# Patient Record
Sex: Female | Born: 1990 | Race: Black or African American | Hispanic: No | Marital: Single | Smoking: Never smoker
Health system: Southern US, Community
[De-identification: ages and names within clinical notes are randomized; demographics above are authoritative.]

## PROBLEM LIST (undated history)

## (undated) ENCOUNTER — Inpatient Hospital Stay (HOSPITAL_COMMUNITY): Payer: Self-pay

## (undated) DIAGNOSIS — Z789 Other specified health status: Secondary | ICD-10-CM

## (undated) HISTORY — PX: BUNIONECTOMY: SHX129

## (undated) HISTORY — PX: WISDOM TOOTH EXTRACTION: SHX21

## (undated) HISTORY — PX: BREAST SURGERY: SHX581

---
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
## 2010-08-15 ENCOUNTER — Encounter: Payer: Self-pay | Admitting: Infectious Diseases

## 2010-08-26 NOTE — Assessment & Plan Note (Signed)
Summary: hsfu need chart large cellulitis infection   Vital Signs:  Patient profile:   20 year old female Height:      68 inches (172.72 cm) Weight:      160.5 pounds (72.95 kg) BMI:     24.49 Temp:     97.0 degrees F (36.11 degrees C) oral Pulse rate:   75 / minute BP sitting:   114 / 70  (left arm)  Vitals Entered By: Baxter Hire) (September 14, 2009 1:55 PM) \  CC: hsfu/cellulitis infection Pain Assessment Patient in pain? no      Nutritional Status BMI of 19 -24 = normal Nutritional Status Detail appetite is okay per patient  Does patient need assistance? Functional Status Self care Ambulation Normal   CC:  hsfu/cellulitis infection.  History of Present Illness: 20 yo F hospitalized earlier this month with dental space infections. Had wisdom teeth pulled 08-20-09. she then developed worsening pain and swelling her mouth and jaw. By 08-25-09 she had wosrenin pain and swelling. MRI (08-25-09)  1.  Persistent to soft tissue stranding and gas within the left  masticator space without evidence for discrete abscess. 2.  Gas within the extractions sockets of the maxillary molars bilaterally and left mandibular molar are similar with some fragmentation of the lateral extraction socket of the maxilla  bilaterally.  3.  Superior extension of gas within the masticator space  bilaterally is improving. 4.  Soft tissue stranding in the right maxillary space over the right face is less conspicuous than on the left.  4. Reactive adenopathy as described. She recieved .anbx in the ED but was given a po rx to take with her. unable to remember name.  Was seen in clinic the following day and then home. She developed worsening throat swelling and pain and was adm to the hospital . She  had I & D on Feb 3.  Was d/c home on Augmentin. Has had refilled recently. Still has trouble with full opening of her mouth and chewing. Her sores in her mouth have healed. no fevers or chills.    Preventive  Screening-Counseling & Management  Alcohol-Tobacco     Alcohol drinks/day: 0     Smoking Status: never  Caffeine-Diet-Exercise     Caffeine use/day: tea     Does Patient Exercise: yes     Type of exercise: walking at school     Exercise (avg: min/session): 30-60     Times/week: 5  Safety-Violence-Falls     Seat Belt Use: yes  Current Medications (verified): 1)  None  Allergies (verified): No Known Drug Allergies  Past History:  Past Medical History: Current Problems:  ABSCESS, TOOTH (ICD-522.5)  Family History: denies  Social History: Single Never Smoked Alcohol use-no student at [employer]  Review of Systems       no lymphadenopathy, wt down (18#), has had loose BM with antibiotics, nl urination, no yeast infections, no history of caries.   Physical Exam  General:  well-developed, well-nourished, and well-hydrated.   Eyes:  pupils equal, pupils round, and pupils reactive to light.   Mouth:  pharynx pink and moist.  limitation of opening her mouth, tenderness of her L masseter muslce, no fluctuatance.  Neck:  no masses.  lymphadenopathy  Lungs:  normal respiratory effort and normal breath sounds.   Heart:  normal rate, regular rhythm, and no murmur.   Abdomen:  soft, non-tender, and normal bowel sounds.     Impression & Recommendations:  Problem # 1:  ABSCESS, TOOTH (ICD-522.5)  spoke with her and her mom at length about this infection. She most likely has gotten an infection of her sockets after she had her wisdom teeth extracted. It is unlikely that she continues to have infection her but they are worried about her pain and limitation of opening. I suggested that we reapeat her MRI to further eval this. She is on a very high dose of augmentin. will not change this. return to clinic 1 week.   Orders: Consultation Level IV (40981) MRI with Contrast (MRI w/Contrast)  Medications Added to Medication List This Visit: 1)  Augmentin 875-125 Mg Tabs (Amoxicillin-pot  clavulanate) .... Take 1 tablet by mouth three times a day   Appended Document: hsfu need chart large cellulitis infection spoke with Dr Randa Evens office. Pt did recieve antibiotics rx after she had wisdom teeth extracted but did not have rx filled for several days.

## 2010-08-26 NOTE — Letter (Signed)
Summary: Out of Work  [facility]  [address]   [place], [place] [address]   Phone: [phone]  Fax: [phone]    September 21, 2009   Employee:  Amanda Rubio    To Whom It May Concern:   For Medical reasons, please excuse the above named employee from work for the following dates:  Start:   09-21-2009  End:   09-21-2009  If you need additional information, please feel free to contact our office.         Sincerely,    Johny Sax MD

## 2010-08-26 NOTE — Miscellaneous (Signed)
Summary: Orders Update  Clinical Lists Changes  Orders: Added new Test order of MRI with Contrast (MRI w/Contrast) - Signed Added new Test order of MRI with Contrast (MRI w/Contrast) - Signed

## 2010-08-26 NOTE — Assessment & Plan Note (Signed)
Summary: 2WK F/U/VS   CC:  2 week follow up.  History of Present Illness: 20 yo F with dental space infections. Had wisdom teeth pulled 08-20-09. she then developed worsening pain and swelling her mouth and jaw. By 08-25-09 she had wosrenign pain and swelling. MRI (08-25-09)  1.  Persistent to soft tissue stranding and gas within the left  masticator space without evidence for discrete abscess. 2.  Gas within the extractions sockets of the maxillary molars bilaterally and left mandibular molar are similar with some fragmentation of the lateral extraction socket of the maxilla  bilaterally.  3.  Superior extension of gas within the masticator space  bilaterally is improving. 4.  Soft tissue stranding in the right maxillary space over the right face is less conspicuous than on the left.  4. Reactive adenopathy as described. She recieved .anbx in the ED but was given a po rx to take with her. unable to remember name.  Was seen in clinic the following day and then home. She developed worsening throat swelling and pain and was adm to the hospital . She  had I & D on Feb 3. Was d/c home on Augmentin. At previous ID visit was sched for MRI but has been delayed due to finances.  Today she feels like she is opening her jaw better, stil some limitation. She is eating regular food. No diarhea.   Preventive Screening-Counseling & Management  Alcohol-Tobacco     Alcohol drinks/day: 0     Smoking Status: never  Caffeine-Diet-Exercise     Caffeine use/day: tea     Does Patient Exercise: yes     Type of exercise: walking at school     Exercise (avg: min/session): 30-60     Times/week: 5  Safety-Violence-Falls     Seat Belt Use: yes   Updated Prior Medication List: MOXATAG 775 MG XR24H-TAB (AMOXICILLIN) three times a day  Current Allergies (reviewed today): No known allergies  Past History:  Past medical, surgical, family and social histories (including risk factors) reviewed, and no changes noted  (except as noted below).  Past Medical History: Reviewed history from 09/14/2009 and no changes required. Current Problems:  ABSCESS, TOOTH (ICD-522.5)  Family History: Reviewed history from 09/14/2009 and no changes required. denies  Social History: Reviewed history from 09/14/2009 and no changes required. Single Never Smoked Alcohol use-no student at [employer]  Review of Systems       some decrease in her swelling of her cheeks/parotid area.   Vital Signs:  Patient profile:   20 year old female Height:      68 inches (172.72 cm) Weight:      162.6 pounds (73.91 kg) BMI:     24.81 Temp:     97.4 degrees F (36.33 degrees C) oral Pulse rate:   73 / minute BP sitting:   122 / 71  (left arm)  Vitals Entered By: Baxter Hire) (October 05, 2009 10:55 AM) CC: 2 week follow up Pain Assessment Patient in pain? no      Nutritional Status BMI of 19 -24 = normal Nutritional Status Detail appetite is okay per patient  Does patient need assistance? Functional Status Self care Ambulation Normal   Physical Exam  General:  well-developed, well-nourished, and well-hydrated.   Mouth:  mild tenderness along mandible on L. no lesions in mouth, no d/c, no fluctuance.    Impression & Recommendations:  Problem # 1:  ABSCESS, TOOTH (ICD-522.5)  awaiting MRI, scheduled for 09-15-09. we will  cont her antibiotics until she has the MRI and then re-eval the need for continued therapy. return to clinic 2 weeks.   Orders: Est. Patient Level II (16109)

## 2010-08-26 NOTE — Assessment & Plan Note (Signed)
Summary: F/U APPT/VS   CC:  follow-up visit.  History of Present Illness: 20 yo F with dental space infections. Had wisdom teeth pulled 08-20-09. she then developed worsening pain and swelling her mouth and jaw. By 08-25-09 she had wosrening pain and swelling. MRI (08-25-09)  1.  Persistent to soft tissue stranding and gas within the left  masticator space without evidence for discrete abscess. 2.  Gas within the extractions sockets of the maxillary molars bilaterally and left mandibular molar are similar with some fragmentation of the lateral extraction socket of the maxilla  bilaterally.  3.  Superior extension of gas within the masticator space  bilaterally is improving. 4.  Soft tissue stranding in the right maxillary space over the right face is less conspicuous than on the left.  4. Reactive adenopathy as described. She recieved .anbx in the ED but was given a po rx to take with her. unable to remember name.  Was seen in clinic the following day and then home. She developed worsening throat swelling and pain and was adm to the hospital . She  had I & D on Feb 3. Was d/c home on Augmentin. F/U MRI 10-12-09 1.  Persistent abnormal enhancement within the muscles of the masticator space, compatible with myositis. 2.  A discrete abscess is not present. 3.  No evidence for osteomyelitis. 4.  Persistent fluid and enhancement within the extraction sockets of the third molars bilaterally in the maxilla and mandible.   occas has feeling that her jaw is "locking up" but otherwise no pain. swelling resolved. sockets are healed. no fever or chills. occas senses a funny taste when lifting her tongue up to the L upper part of her mouth.   Preventive Screening-Counseling & Management  Alcohol-Tobacco     Alcohol drinks/day: 0     Smoking Status: never  Caffeine-Diet-Exercise     Caffeine use/day: tea     Type of exercise: active at home  Safety-Violence-Falls     Seat Belt Use: yes   Updated Prior  Medication List: MOXATAG 775 MG XR24H-TAB (AMOXICILLIN) three times a day  Current Allergies (reviewed today): No known allergies  Past History:  Past medical, surgical, family and social histories (including risk factors) reviewed, and no changes noted (except as noted below).  Past Medical History: Reviewed history from 09/14/2009 and no changes required. Current Problems:  ABSCESS, TOOTH (ICD-522.5)  Family History: Reviewed history from 09/14/2009 and no changes required. denies  Social History: Reviewed history from 09/14/2009 and no changes required. Single Never Smoked Alcohol use-no student at [employer]  Vital Signs:  Patient profile:   20 year old female Height:      68 inches (172.72 cm) Weight:      162.8 pounds (74 kg) BMI:     24.84 Temp:     98.2 degrees F (36.78 degrees C) oral Pulse rate:   94 / minute BP sitting:   112 / 73  (right arm)  Vitals Entered By: Baxter Hire) (Dec 07, 2009 10:33 AM) CC: follow-up visit Pain Assessment Patient in pain? yes     Location: left side of jaw Intensity: 6 Type: locking Onset of pain  the feeling of jaw locking comes and goes Nutritional Status BMI of 19 -24 = normal Nutritional Status Detail appetite is good per patient  Does patient need assistance? Functional Status Self care Ambulation Normal   Physical Exam  General:  well-developed, well-nourished, and well-hydrated.   Mouth:  without lesion good dentition,  no gingival abnormalities, and pharynx pink and moist.   Neck:  no masses and no neck tenderness.  no submental tenderness or swelling.    Impression & Recommendations:  Problem # 1:  ABSCESS, TOOTH (ICD-522.5) Assessment Improved  she is doing well. she has completed the augmentin. will watch her clinically as her previous signs appear to have resolved (swelling, tenderness; limitation of jaw opening is near normal now). we discussed repeating MRI but will watch her clinically.  she  will f/u as needed   Orders: Est. Patient Level II (04540)

## 2010-08-26 NOTE — Assessment & Plan Note (Signed)
Summary: 1WK F/U/VS   CC:  1 week follow up.  History of Present Illness: 20 yo F hospitalized earlier this month with dental space infections. Had wisdom teeth pulled 08-20-09. she then developed worsening pain and swelling her mouth and jaw. By 08-25-09 she had wosrenign pain and swelling. MRI (08-25-09)  1.  Persistent to soft tissue stranding and gas within the left  masticator space without evidence for discrete abscess. 2.  Gas within the extractions sockets of the maxillary molars bilaterally and left mandibular molar are similar with some fragmentation of the lateral extraction socket of the maxilla  bilaterally.  3.  Superior extension of gas within the masticator space  bilaterally is improving. 4.  Soft tissue stranding in the right maxillary space over the right face is less conspicuous than on the left.  4. Reactive adenopathy as described. She recieved .anbx in the ED but was given a po rx to take with her. unable to remember name.  Was seen in clinic the following day and then home. She developed worsening throat swelling and pain and was adm to the hospital . She  had I & D on Feb 3. Was d/c home on Augmentin. She continues to have difficulty with opening her mouth. she has to have her food chopped up. no fevers or chills. no d/c from previous sockets. on amoxitab samples now.    Preventive Screening-Counseling & Management  Alcohol-Tobacco     Alcohol drinks/day: 0     Smoking Status: never  Caffeine-Diet-Exercise     Caffeine use/day: tea     Does Patient Exercise: yes     Type of exercise: walking at school     Exercise (avg: min/session): 30-60     Times/week: 5  Safety-Violence-Falls     Seat Belt Use: yes   Current Allergies (reviewed today): No known allergies  Review of Systems       wt up 1.9#  Vital Signs:  Patient profile:   20 year old female Height:      68 inches (172.72 cm) Weight:      161.9 pounds (73.59 kg) BMI:     24.71 Temp:     97.8 degrees F  (36.56 degrees C) oral Pulse rate:   72 / minute BP sitting:   110 / 72  (right arm)  Vitals Entered By: Baxter Hire) (September 21, 2009 9:33 AM) CC: 1 week follow up Pain Assessment Patient in pain? no      Nutritional Status BMI of 19 -24 = normal Nutritional Status Detail appetite is okay per patient  Does patient need assistance? Functional Status Self care Ambulation Normal   Physical Exam  General:  well-developed, well-nourished, and well-hydrated.   Mouth:  pharynx pink and moist and no exudates.  some tenderness over L maxilla near TMJ. no fluctuance, no increase in heat.  Neck:  no masses.   Cervical Nodes:  no anterior cervical adenopathy.     Impression & Recommendations:  Problem # 1:  ABSCESS, TOOTH (ICD-522.5)  she has made some improvement. at this point will continue her on amoxil and check imaging of her face/maxilofacial. she is awaiting 'orange card' will see her back in 2 weeks.   Orders: Consultation Level III [phone]) CT with Contrast (CT w/ contrast)  Medications Added to Medication List This Visit: 1)  Moxatag 775 Mg Xr24h-tab (Amoxicillin) .... Three times a day

## 2010-08-26 NOTE — Miscellaneous (Signed)
Summary: HIPAA Restrictions  HIPAA Restrictions   Imported By: Florinda Marker 09/14/2009 16:35:22  _____________________________________________________________________  External Attachment:    Type:   Image     Comment:   External Document

## 2010-08-26 NOTE — Miscellaneous (Signed)
Summary: Orders Update - change MRI order to w/ and w/o  Clinical Lists Changes  Orders: Added new Test order of MRI with & without Contrast (MRI w&w/o Contrast) - Signed Added new Test order of MRI with & without Contrast (MRI w&w/o Contrast) - Signed

## 2010-10-11 LAB — DIFFERENTIAL
Basophils Absolute: 0 10*3/uL (ref 0.0–0.1)
Basophils Relative: 0 % (ref 0–1)
Basophils Relative: 1 % (ref 0–1)
Eosinophils Absolute: 0.1 10*3/uL (ref 0.0–0.7)
Eosinophils Absolute: 0.1 10*3/uL (ref 0.0–0.7)
Lymphocytes Relative: 10 % — ABNORMAL LOW (ref 12–46)
Lymphocytes Relative: 22 % (ref 12–46)
Lymphs Abs: 1.4 10*3/uL (ref 0.7–4.0)
Monocytes Absolute: 0.9 10*3/uL (ref 0.1–1.0)
Monocytes Relative: 10 % (ref 3–12)
Neutro Abs: 8 10*3/uL — ABNORMAL HIGH (ref 1.7–7.7)
Neutrophils Relative %: 80 % — ABNORMAL HIGH (ref 43–77)
Neutrophils Relative %: 67 % (ref 43–77)

## 2010-10-11 LAB — COMPREHENSIVE METABOLIC PANEL
ALT: 11 U/L (ref 0–35)
Albumin: 3.7 g/dL (ref 3.5–5.2)
Alkaline Phosphatase: 57 U/L (ref 39–117)
CO2: 25 mEq/L (ref 19–32)
Calcium: 9.5 mg/dL (ref 8.4–10.5)
Creatinine, Ser: 0.78 mg/dL (ref 0.4–1.2)
GFR calc non Af Amer: >60 mL/min (ref >60)
Sodium: 136 mEq/L (ref 135–145)
Total Protein: 7.4 g/dL (ref 6.0–8.3)

## 2010-10-11 LAB — POCT I-STAT, CHEM 8
BUN: 7 mg/dL (ref 6–23)
Calcium, Ion: 1.22 mmol/L (ref 1.12–1.32)
Chloride: 108 mEq/L (ref 96–112)
Glucose, Bld: 92 mg/dL (ref 70–99)
HCT: 41 % (ref 36.0–46.0)
Hemoglobin: 13.9 g/dL (ref 12.0–15.0)
TCO2: 24 mmol/L (ref 0–100)

## 2010-10-11 LAB — URINE MICROSCOPIC-ADD ON

## 2010-10-11 LAB — URINALYSIS, ROUTINE W REFLEX MICROSCOPIC
Glucose, UA: NEGATIVE mg/dL
Ketones, ur: NEGATIVE mg/dL
Nitrite: NEGATIVE
Specific Gravity, Urine: 1.013 (ref 1.005–1.030)

## 2010-10-11 LAB — WET PREP, GENITAL

## 2010-10-11 LAB — CBC
HCT: 36.4 % (ref 36.0–46.0)
Hemoglobin: 11.6 g/dL — ABNORMAL LOW (ref 12.0–15.0)
MCHC: 34.4 g/dL (ref 30.0–36.0)
MCHC: 34.2 g/dL (ref 30.0–36.0)
Platelets: 207 10*3/uL (ref 150–400)
RBC: 3.99 MIL/uL (ref 3.87–5.11)
RDW: 12.7 % (ref 11.5–15.5)
WBC: 10 10*3/uL (ref 4.0–10.5)
WBC: 12.1 10*3/uL — ABNORMAL HIGH (ref 4.0–10.5)
WBC: 6.4 10*3/uL (ref 4.0–10.5)

## 2010-10-11 LAB — URINE CULTURE: Colony Count: >=100000

## 2010-10-11 LAB — GC/CHLAMYDIA PROBE AMP, GENITAL: GC Probe Amp, Genital: NEGATIVE

## 2010-10-14 LAB — CBC
HCT: 33.7 % — ABNORMAL LOW (ref 36.0–46.0)
HCT: 34.1 % — ABNORMAL LOW (ref 36.0–46.0)
HCT: 34.4 % — ABNORMAL LOW (ref 36.0–46.0)
HCT: 35.3 % — ABNORMAL LOW (ref 36.0–46.0)
Hemoglobin: 11.3 g/dL — ABNORMAL LOW (ref 12.0–15.0)
Hemoglobin: 11.4 g/dL — ABNORMAL LOW (ref 12.0–15.0)
Hemoglobin: 11.6 g/dL — ABNORMAL LOW (ref 12.0–15.0)
Hemoglobin: 12 g/dL (ref 12.0–15.0)
MCHC: 33.9 g/dL (ref 30.0–36.0)
MCHC: 34 g/dL (ref 30.0–36.0)
MCHC: 34.3 g/dL (ref 30.0–36.0)
MCHC: 34.4 g/dL (ref 30.0–36.0)
MCHC: 34.4 g/dL (ref 30.0–36.0)
MCV: 90 fL (ref 78.0–100.0)
MCV: 91 fL (ref 78.0–100.0)
MCV: 91.8 fL (ref 78.0–100.0)
Platelets: 219 10*3/uL (ref 150–400)
Platelets: 226 10*3/uL (ref 150–400)
Platelets: 229 10*3/uL (ref 150–400)
Platelets: 245 10*3/uL (ref 150–400)
Platelets: 293 10*3/uL (ref 150–400)
Platelets: 326 10*3/uL (ref 150–400)
RBC: 3.63 MIL/uL — ABNORMAL LOW (ref 3.87–5.11)
RBC: 3.71 MIL/uL — ABNORMAL LOW (ref 3.87–5.11)
RBC: 3.91 MIL/uL (ref 3.87–5.11)
RDW: 12.8 % (ref 11.5–15.5)
RDW: 12.9 % (ref 11.5–15.5)
RDW: 12.9 % (ref 11.5–15.5)
RDW: 13.2 % (ref 11.5–15.5)
WBC: 12.3 10*3/uL — ABNORMAL HIGH (ref 4.0–10.5)
WBC: 8.7 10*3/uL (ref 4.0–10.5)
WBC: 8.8 10*3/uL (ref 4.0–10.5)

## 2010-10-14 LAB — CULTURE, ROUTINE-ABSCESS

## 2010-10-14 LAB — ANAEROBIC CULTURE

## 2010-12-10 NOTE — Op Note (Signed)
NAME:  Amanda Rubio, Amanda Rubio [facility]                      ACCOUNT NO.:  [id number]   MEDICAL RECORD NO.:  [id number]                   PATIENT TYPE:  AMB   LOCATION:  DSC                                  FACILITY:  [facility]   PHYSICIAN:  Leonie Man, M.D.                DATE OF BIRTH:  [birth year]   DATE OF PROCEDURE:  12/25/2003  DATE OF DISCHARGE:                                 OPERATIVE REPORT   PREOPERATIVE DIAGNOSIS:  Left breast mass, probable fibroadenoma.   POSTOPERATIVE DIAGNOSIS:  Left breast mass, probable fibroadenoma.  Pathology pending.   OPERATION PERFORMED:  Excisional biopsy of left breast mass.   SURGEON:  Leonie Man, M.D.   ASSISTANT:  Nurse.   ANESTHESIA:  General.   INDICATIONS FOR PROCEDURE:  The patient is a 20 year old female with an  enlarging left-sided breast mass located just at the areolar border.  Both  she and her parents desire excisional biopsy of this lesion.  She comes to  the operating room now after the risks and potential benefits of this have  been discussed with her and her family and they give consent.   DESCRIPTION OF PROCEDURE:  Following the induction of satisfactory general  anesthesia, the patient positioned supinely and the left breast was prepped  and draped to be included in a sterile operative field.  A circumareolar  incision was made on the inferior edge of the areolar border deepening this  through the skin and subcutaneous tissue raising a flap inferiorly and  dissecting down to the mass.  The mass was dissected free and removed in its  entirety and forwarded for pathologic evaluation.  Hemostasis was obtained  with electrocautery.  Sponge and instrument counts were verified.  Breast  tissues were reapproximated with interrupted 2-0 Vicryl sutures.  Subcutaneous tissue was closed with interrupted 2-0 Vicryl sutures.  Skin  closed with running 5-0 Monocryl suture and then reinforced with Steri-  Strips.  Sterile dressing applied.   Anesthetic was reversed and the patient  removed from the operating room to the recovery room in stable condition.  She tolerated the procedure well.                                               Leonie Man, M.D.    PB/MEDQ  D:  12/25/2003  T:  12/25/2003  Job:  [id number]

## 2011-05-04 ENCOUNTER — Emergency Department (HOSPITAL_BASED_OUTPATIENT_CLINIC_OR_DEPARTMENT_OTHER)
Admission: EM | Admit: 2011-05-04 | Discharge: 2011-05-04 | Disposition: A | Discharge status: Home / Self Care | Payer: Self-pay | Attending: Emergency Medicine | Admitting: Emergency Medicine

## 2011-05-04 DIAGNOSIS — A499 Bacterial infection, unspecified: Secondary | ICD-10-CM | POA: Insufficient documentation

## 2011-05-04 DIAGNOSIS — N390 Urinary tract infection, site not specified: Secondary | ICD-10-CM | POA: Insufficient documentation

## 2011-05-04 DIAGNOSIS — N760 Acute vaginitis: Secondary | ICD-10-CM | POA: Insufficient documentation

## 2011-05-04 DIAGNOSIS — R51 Headache: Secondary | ICD-10-CM | POA: Insufficient documentation

## 2011-05-04 DIAGNOSIS — B9689 Other specified bacterial agents as the cause of diseases classified elsewhere: Secondary | ICD-10-CM | POA: Insufficient documentation

## 2011-05-04 LAB — URINALYSIS, ROUTINE W REFLEX MICROSCOPIC
Bilirubin Urine: NEGATIVE
Nitrite: NEGATIVE
Specific Gravity, Urine: 1.027 (ref 1.005–1.030)
Urobilinogen, UA: 1 mg/dL (ref 0.0–1.0)
pH: 6 (ref 5.0–8.0)

## 2011-05-04 LAB — WET PREP, GENITAL: Trich, Wet Prep: NONE SEEN

## 2011-05-04 LAB — URINE MICROSCOPIC-ADD ON

## 2011-05-04 MED ORDER — KETOROLAC TROMETHAMINE 60 MG/2ML IM SOLN
60.0000 mg | Freq: Once | INTRAMUSCULAR | Status: AC
  Administered 2011-05-04: 60 mg via INTRAMUSCULAR
  Filled 2011-05-04: qty 2

## 2011-05-04 MED ORDER — METOCLOPRAMIDE HCL 5 MG/ML IJ SOLN
10.0000 mg | Freq: Once | INTRAMUSCULAR | Status: AC
  Administered 2011-05-04: 10 mg via INTRAMUSCULAR
  Filled 2011-05-04: qty 2

## 2011-05-04 MED ORDER — SULFAMETHOXAZOLE-TRIMETHOPRIM 800-160 MG PO TABS: 1.0000 | ORAL_TABLET | Freq: Two times a day (BID) | ORAL | Status: AC

## 2011-05-04 MED ORDER — METRONIDAZOLE 500 MG PO TABS: 500.0000 mg | ORAL_TABLET | Freq: Two times a day (BID) | ORAL | Status: AC

## 2011-05-04 NOTE — ED Notes (Signed)
C/o HA behind left eye x 8 months-bilat lower abd cramps x 1 month

## 2011-05-04 NOTE — ED Provider Notes (Signed)
History     CSN: [id number] Arrival date & time: 05/04/2011 11:23 AM  Chief Complaint  Patient presents with  . Abdominal Pain  . Headache    (Consider location/radiation/quality/duration/timing/severity/associated sxs/prior treatment) Patient is a 20 y.o. female presenting with abdominal pain and headaches. The history is provided by the patient. No language interpreter was used.  Abdominal Pain The primary symptoms of the illness include abdominal pain and vaginal discharge. The primary symptoms of the illness do not include fever, nausea, vomiting or vaginal bleeding. The current episode started more than 2 days ago. The onset of the illness was gradual. The problem has not changed since onset. The patient states that she believes she is currently not pregnant. The patient has not had a change in bowel habit. Symptoms associated with the illness do not include urgency, frequency or back pain.  Headache  This is a recurrent problem. The current episode started more than 1 week ago. The problem has not changed since onset.The headache is associated with an unknown factor. The pain is located in the left unilateral region. The quality of the pain is described as throbbing. Pertinent negatives include no fever, no nausea and no vomiting. She has tried nothing for the symptoms. The treatment provided no relief.    History reviewed. No pertinent past medical history.  Past Surgical History  Procedure Date  . Bunionectomy   . Wisdom tooth extraction   . Breast surgery     No family history on file.  History  Substance Use Topics  . Smoking status: Never Smoker   . Smokeless tobacco: Not on file  . Alcohol Use: No    OB History    Grav Para Term Preterm Abortions TAB SAB Ect Mult Living                  Review of Systems  Constitutional: Negative for fever.  Gastrointestinal: Positive for abdominal pain. Negative for nausea and vomiting.  Genitourinary: Positive for vaginal  discharge. Negative for urgency, frequency and vaginal bleeding.  Musculoskeletal: Negative for back pain.  Neurological: Positive for headaches.  All other systems reviewed and are negative.    Allergies  Review of patient's allergies indicates no known allergies.  Home Medications   Current Outpatient Rx  Name Route Sig Dispense Refill  . AMOXICILLIN 775 MG PO TB24 Oral Take 775 mg by mouth 3 (three) times daily.        BP 144/81  Pulse 91  Temp(Src) 98.3 F (36.8 C) (Oral)  Resp 16  SpO2 97%  Physical Exam  Nursing note and vitals reviewed. Constitutional: She is oriented to person, place, and time. She appears well-developed and well-nourished.  HENT:  Head: Atraumatic.  Right Ear: External ear normal.  Left Ear: External ear normal.  Eyes: Conjunctivae are normal. Pupils are equal, round, and reactive to light.  Neck: Normal range of motion. Neck supple.  Cardiovascular: Normal rate and regular rhythm.   Pulmonary/Chest: Effort normal and breath sounds normal.  Abdominal: Soft. Bowel sounds are normal.  Genitourinary: Cervix exhibits no motion tenderness. Vaginal discharge found.  Musculoskeletal: Normal range of motion.  Neurological: She is alert and oriented to person, place, and time.  Skin: Skin is warm and dry.  Psychiatric: She has a normal mood and affect.    ED Course  Procedures (including critical care time)  Labs Reviewed  URINALYSIS, ROUTINE W REFLEX MICROSCOPIC - Abnormal; Notable for the following:    Color, Urine AMBER (*) BIOCHEMICALS  MAY BE AFFECTED BY COLOR   Appearance CLOUDY (*)    Ketones, ur 15 (*)    Protein, ur 30 (*)    Leukocytes, UA MODERATE (*)    All other components within normal limits  URINE MICROSCOPIC-ADD ON - Abnormal; Notable for the following:    Squamous Epithelial / LPF MANY (*)    Bacteria, UA MANY (*)    All other components within normal limits  WET PREP, GENITAL - Abnormal; Notable for the following:    Clue  Cells, Wet Prep FEW (*)    WBC, Wet Prep HPF POC MODERATE (*)    All other components within normal limits  PREGNANCY, URINE  GC/CHLAMYDIA PROBE AMP, GENITAL   No results found.   1. Bacterial vaginosis   2. UTI (lower urinary tract infection)   3. Headache       MDM  Will treat pt for uti and bv:pt is feeling better as far as the headache is concerned at this time   Medical screening examination/treatment/procedure(s) were performed by non-physician practitioner and as supervising physician I was immediately available for consultation/collaboration.    Teressa Lower, NP 05/04/11 1248  Suzi Roots, MD 05/14/11 [phone]

## 2011-05-06 LAB — GC/CHLAMYDIA PROBE AMP, GENITAL: GC Probe Amp, Genital: NEGATIVE

## 2011-05-07 NOTE — ED Notes (Signed)
+   chlamydia Chart sent to EDP office for review. 

## 2011-05-12 NOTE — ED Notes (Signed)
Prescription called in to [facility] at [phone] for doxycycline 100 mg po bid for 7 days; no refills.

## 2012-01-20 IMAGING — CT CT MAXILLOFACIAL W/ CM
3 series · 15 of 47 positions shown, 18 images · IV contrast (omnipaque)
Comparison: CT neck 08/23/2009.

CLINICAL DATA: Bilateral mandibular cellulitis.  Difficulty
swallowing.  Increasing white count.  Status post wisdom tooth
extraction.

CT MAXILLOFACIAL WITH CONTRAST
TECHNIQUE: Multidetector CT imaging of the maxillofacial
structures was performed with intravenous contrast. Multiplanar CT
image reconstructions were also generated.
Contrast: 100 ml Omnipaque 300

[Series 3: orbit/facial 2.0 h30s · axial · 0.35mm/px · z∈[+1220,+1362]mm · 9 of 83 slices shown, 12 images]
[im 6/83  brain]
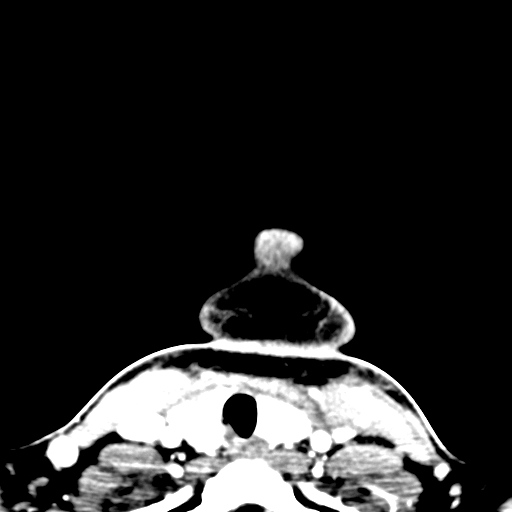
[im 6/83  bone]
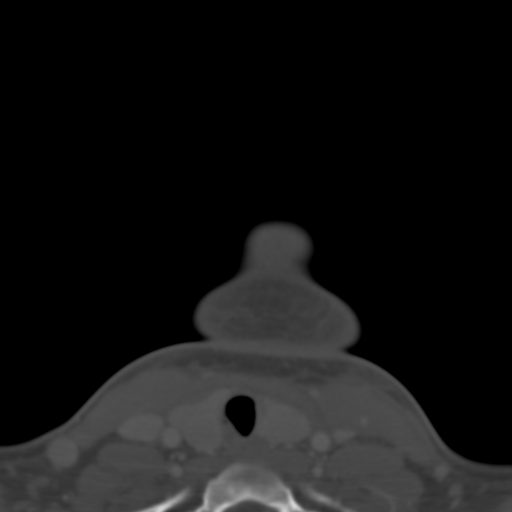
[im 15/83  bone]
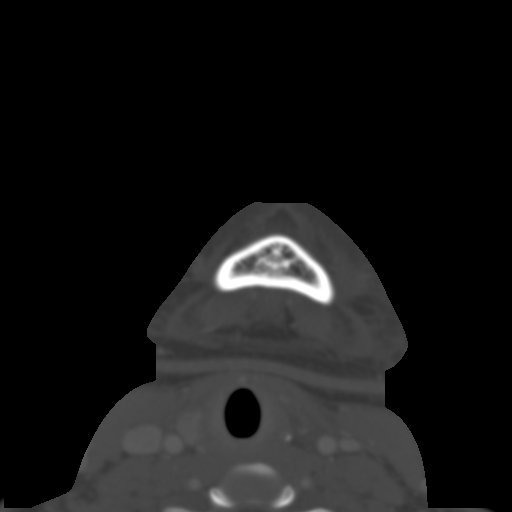
[im 23/83  bone]
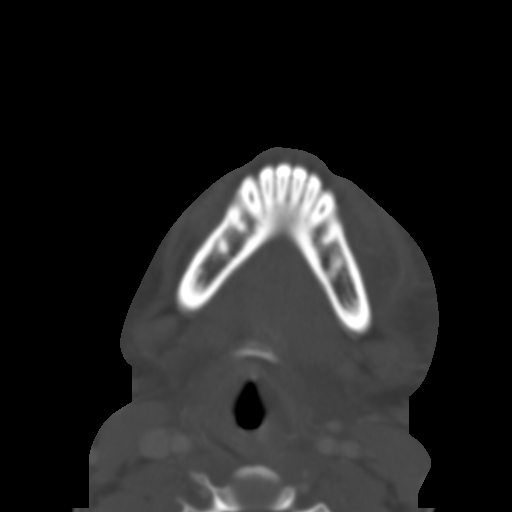
[im 32/83  bone]
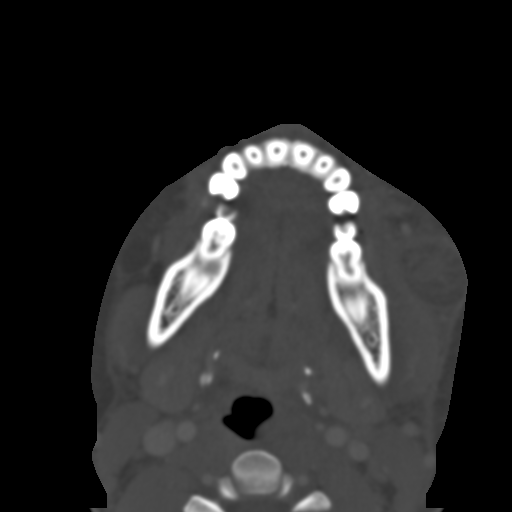
[im 43/83  brain]
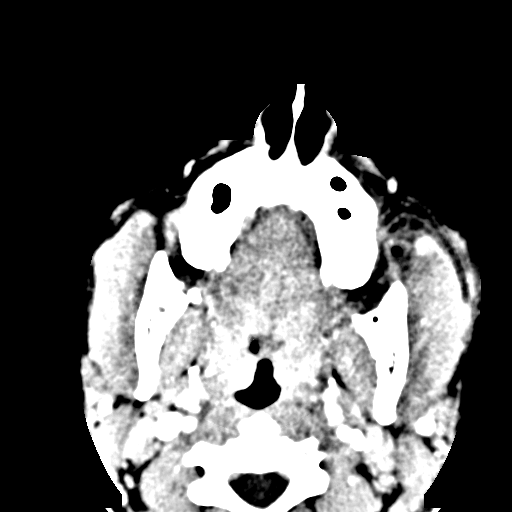
[im 43/83  bone]
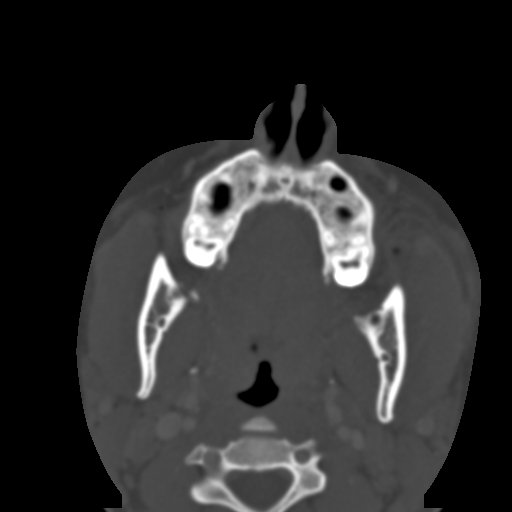
[im 51/83  bone]
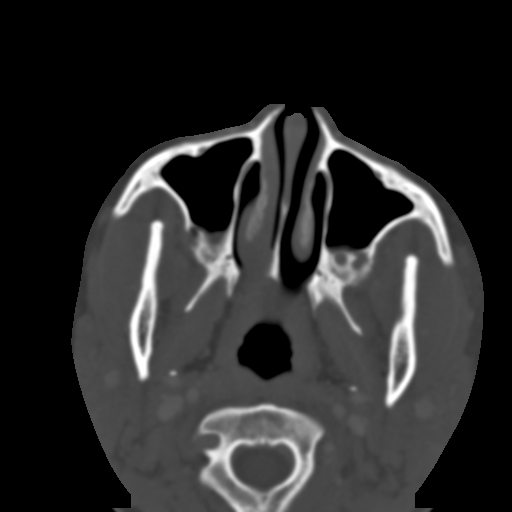
[im 60/83  bone]
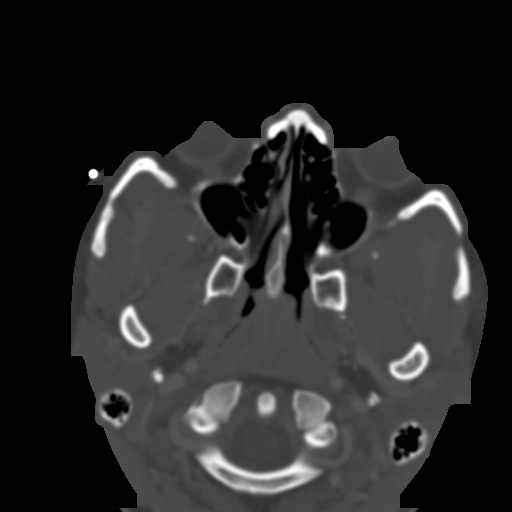
[im 68/83  bone]
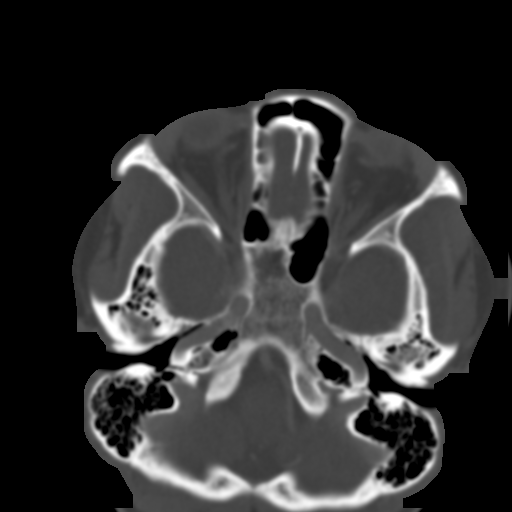
[im 77/83  brain]
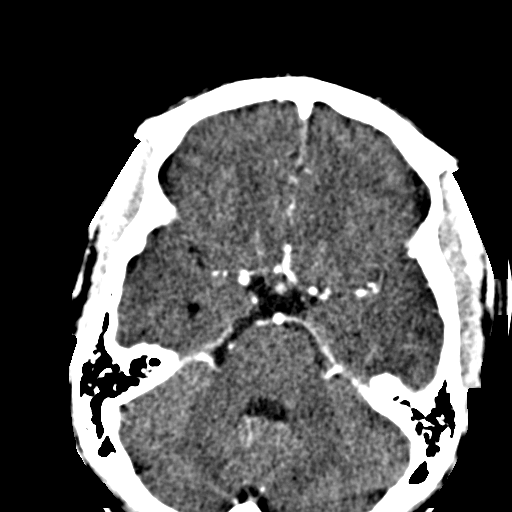
[im 77/83  bone]
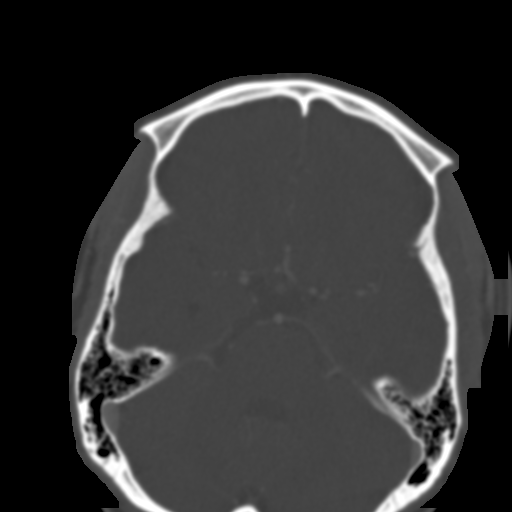

[Series 604: cor st · coronal · 0.35mm/px · 3 of 68 slices shown]
[im 23/68  bone]
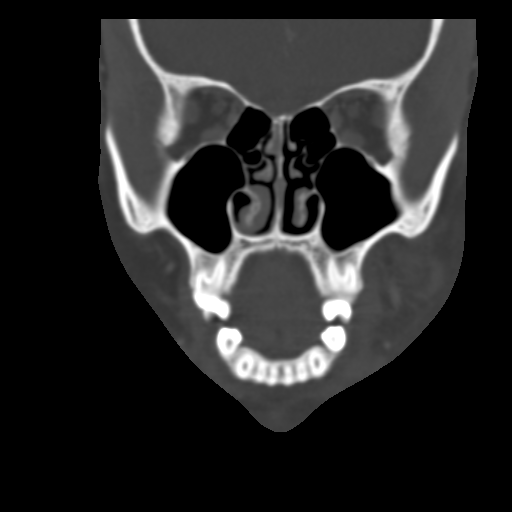
[im 30/68  bone]
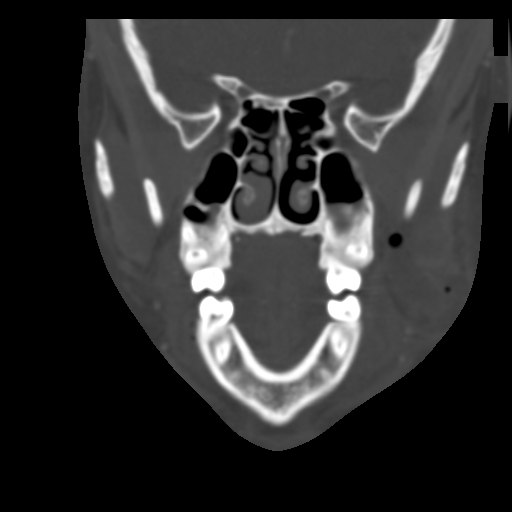
[im 38/68  bone]
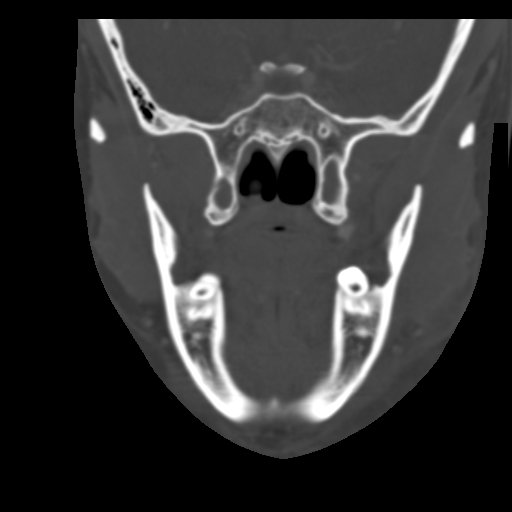

[Series 605: <mpr thick range> · sagittal · 0.35mm/px · 3 of 77 slices shown]
[im 26/77  bone]
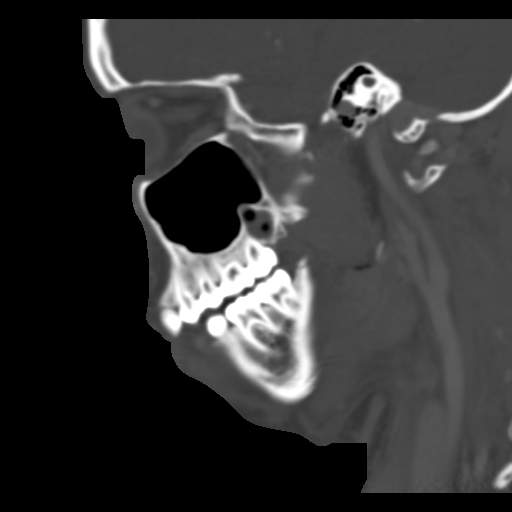
[im 39/77  bone]
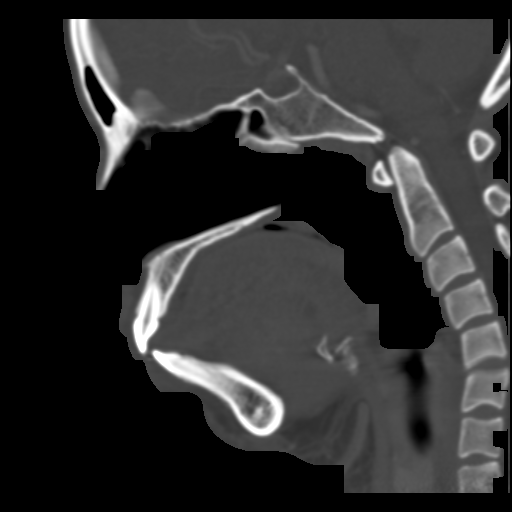
[im 51/77  bone]
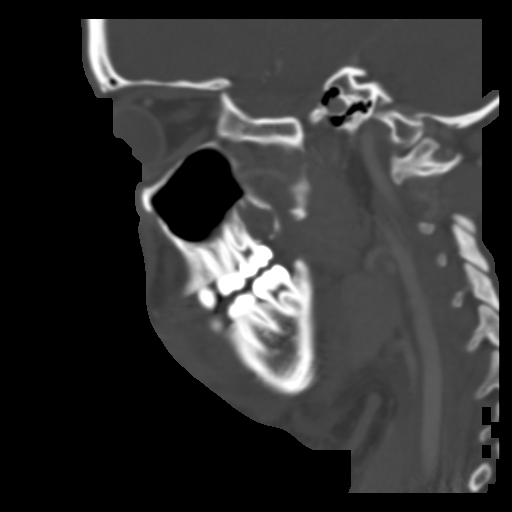

[15 of 47 positions shown; findings below may reference images not displayed]

FINDINGS: The the patient is status post extraction of the third
mandibular and maxillary molars bilaterally.  Gas continues to be
seen within the extraction sockets of the maxillary molars
bilaterally.  There is gas within the left 3rd molar mandibular
extractions socket.  Only soft tissue is present on the right.

Fragmentation of the lateral aspect of the left maxillary
extraction socket is similar to the prior study.  Fragmentation
laterally on the right is similar as well.

Edematous changes are present within the masticator space on the
left with areas of gas again noted.  Extensive stranding of the
subcutaneous soft tissues over left face are again noted.  No
discrete abscess is evident although there may be some fluid within
the masticator space.  The parapharyngeal fat is clean bilaterally.

There is some stranding within the masticator space on the right
and mild stranding of the subcutaneous soft tissues as well, to a
lesser extent.  The gas previously seen extending superiorly to the
left pterygomandibular fissure is no longer present.

Bilateral submandibular adenopathy is again noted, similar to the
prior study.  Bilateral level II lymph nodes are also present,
worse on the left.

Limited imaging of the brain is unremarkable.
IMPRESSION: 1.  Persistent to soft tissue stranding and gas within the left
masticator space without evidence for discrete abscess.
2.  Gas within the extractions sockets of the maxillary molars
bilaterally and left mandibular molar are similar with some
fragmentation of the lateral extraction socket of the maxilla
bilaterally.
3.  Superior extension of gas within the masticator space
bilaterally is improving.

4.  Soft tissue stranding in the right maxillary space over the
right face is less conspicuous than on the left.
4.  Reactive adenopathy as described.

## 2013-01-24 ENCOUNTER — Inpatient Hospital Stay (HOSPITAL_COMMUNITY)
Admission: AD | Admit: 2013-01-24 | Discharge: 2013-01-24 | Disposition: A | Discharge status: Home / Self Care | Payer: Self-pay | Source: Ambulatory Visit | Attending: Obstetrics & Gynecology | Admitting: Obstetrics & Gynecology

## 2013-01-24 ENCOUNTER — Encounter (HOSPITAL_COMMUNITY): Payer: Self-pay | Admitting: *Deleted

## 2013-01-24 DIAGNOSIS — A5901 Trichomonal vulvovaginitis: Secondary | ICD-10-CM | POA: Insufficient documentation

## 2013-01-24 DIAGNOSIS — B9689 Other specified bacterial agents as the cause of diseases classified elsewhere: Secondary | ICD-10-CM

## 2013-01-24 DIAGNOSIS — A499 Bacterial infection, unspecified: Secondary | ICD-10-CM

## 2013-01-24 DIAGNOSIS — N760 Acute vaginitis: Secondary | ICD-10-CM

## 2013-01-24 DIAGNOSIS — Z3202 Encounter for pregnancy test, result negative: Secondary | ICD-10-CM | POA: Insufficient documentation

## 2013-01-24 DIAGNOSIS — R109 Unspecified abdominal pain: Secondary | ICD-10-CM | POA: Insufficient documentation

## 2013-01-24 DIAGNOSIS — N920 Excessive and frequent menstruation with regular cycle: Secondary | ICD-10-CM

## 2013-01-24 DIAGNOSIS — A599 Trichomoniasis, unspecified: Secondary | ICD-10-CM

## 2013-01-24 HISTORY — DX: Other specified health status: Z78.9

## 2013-01-24 LAB — URINALYSIS, ROUTINE W REFLEX MICROSCOPIC
Bilirubin Urine: NEGATIVE
Nitrite: NEGATIVE
Protein, ur: 30 mg/dL — AB
Specific Gravity, Urine: 1.025 (ref 1.005–1.030)
Urobilinogen, UA: 1 mg/dL (ref 0.0–1.0)

## 2013-01-24 LAB — WET PREP, GENITAL: Yeast Wet Prep HPF POC: NONE SEEN

## 2013-01-24 LAB — POCT PREGNANCY, URINE: Preg Test, Ur: NEGATIVE

## 2013-01-24 LAB — CBC
MCH: 28 pg (ref 26.0–34.0)
MCHC: 32.7 g/dL (ref 30.0–36.0)
MCV: 85.5 fL (ref 78.0–100.0)
Platelets: 275 10*3/uL (ref 150–400)
RDW: 13.1 % (ref 11.5–15.5)

## 2013-01-24 MED ORDER — METRONIDAZOLE 500 MG PO TABS: 500.0000 mg | ORAL_TABLET | Freq: Two times a day (BID) | ORAL | Status: DC

## 2013-01-24 NOTE — MAU Provider Note (Signed)
Attestation of Attending Supervision of Advanced Practitioner (PA/CNM/NP): Evaluation and management procedures were performed by the Advanced Practitioner under my supervision and collaboration.  I have reviewed the Advanced Practitioner's note and chart, and I agree with the management and plan.  Aariona Momon, MD, FACOG Attending Obstetrician & Gynecologist Faculty Practice, [facility]  

## 2013-01-24 NOTE — MAU Note (Signed)
Patient states she started having abdominal cramping on 6-30. Started spotting on 7-1 and this morning had heavy bleeding and passed a fist size clot x 2. Continues to bleeding like a period and having cramping.

## 2013-01-24 NOTE — MAU Provider Note (Signed)
History     CSN: [id number]  Arrival date and time: 01/24/13 1341   None     Chief Complaint  Patient presents with  . Vaginal Bleeding  . Abdominal Cramping   HPI Amanda Rubio is a 22 y.o. G47P1001 female who presents w/ report of LNMP 6/11, abdominal cramping began 6/30 w/ pink d/c w/ normal menses flow on 7/1, heavier bleeding this am- woke up and clothes/bed was stained, passed 2 clots the size of an orange, 1 smaller clot since here. Has changed saturated pad x 4 in last 3.5hours. Periods normally regular, has never been this heavy or w/ clots. Sexually active, not using any birth control, does not desire pregnancy. Denies abnormal or odorous d/c, vulvovaginal itching/irritation.    OB History   Grav Para Term Preterm Abortions TAB SAB Ect Mult Living   1 1 1       1       Past Medical History  Diagnosis Date  . Medical history non-contributory     Past Surgical History  Procedure Laterality Date  . Bunionectomy    . Wisdom tooth extraction    . Breast surgery      No family history on file.  History  Substance Use Topics  . Smoking status: Never Smoker   . Smokeless tobacco: Not on file  . Alcohol Use: Yes     Comment: very rare    Allergies: No Known Allergies  No prescriptions prior to admission    Review of Systems  Constitutional: Negative.   HENT: Negative.   Eyes: Negative.   Respiratory: Negative.   Cardiovascular: Negative.   Gastrointestinal: Positive for abdominal pain (cramping).  Genitourinary: Negative.   Musculoskeletal: Negative.   Skin: Negative.   Neurological: Negative.   Endo/Heme/Allergies: Negative.   Psychiatric/Behavioral: Negative.    Physical Exam   Blood pressure 125/75, pulse 80, temperature 99.2 F (37.3 C), temperature source Oral, resp. rate 16, height 5' 6.5" (1.689 m), weight 90.266 kg (199 lb), last menstrual period 01/02/2013, SpO2 100.00%.  Physical Exam  Constitutional: She is oriented to person, place,  and time. She appears well-developed and well-nourished.  HENT:  Head: Normocephalic.  Neck: Normal range of motion.  Cardiovascular: Normal rate.   Respiratory: Effort normal.  GI: Soft. She exhibits no distension. There is no tenderness. There is no rebound and no guarding.  Genitourinary:  Spec exam: sm/moderate amount of dark red/menstrual type blood in vaginal vault. No cervical polyps or abnormalities noted. No active bleeding from os. Wet prep and gc/ch obtained  Bimanual: -CMT, uterus normal size/shape/contour, bilateral adnexae non-tender  Musculoskeletal: Normal range of motion.  Neurological: She is alert and oriented to person, place, and time.  Skin: Skin is warm and dry.  Psychiatric: She has a normal mood and affect. Her behavior is normal. Judgment and thought content normal.    MAU Course  Procedures  Spec exam Bimanual GC/Ch Wet prep CBC  Results for orders placed during the hospital encounter of 01/24/13 (from the past 24 hour(s))  URINALYSIS, ROUTINE W REFLEX MICROSCOPIC     Status: Abnormal   Collection Time    01/24/13  2:20 PM      Result Value Range   Color, Urine YELLOW  YELLOW   APPearance CLEAR  CLEAR   Specific Gravity, Urine 1.025  1.005 - 1.030   pH 6.0  5.0 - 8.0   Glucose, UA NEGATIVE  NEGATIVE mg/dL   Hgb urine dipstick LARGE (*) NEGATIVE  Bilirubin Urine NEGATIVE  NEGATIVE   Ketones, ur NEGATIVE  NEGATIVE mg/dL   Protein, ur 30 (*) NEGATIVE mg/dL   Urobilinogen, UA 1.0  0.0 - 1.0 mg/dL   Nitrite NEGATIVE  NEGATIVE   Leukocytes, UA TRACE (*) NEGATIVE  URINE MICROSCOPIC-ADD ON     Status: None   Collection Time    01/24/13  2:20 PM      Result Value Range   Squamous Epithelial / LPF RARE  RARE   WBC, UA 0-2  <3 WBC/hpf   RBC / HPF TOO NUMEROUS TO COUNT  <3 RBC/hpf   Bacteria, UA RARE  RARE  POCT PREGNANCY, URINE     Status: None   Collection Time    01/24/13  2:27 PM      Result Value Range   Preg Test, Ur NEGATIVE  NEGATIVE   WET PREP, GENITAL     Status: Abnormal   Collection Time    01/24/13  3:30 PM      Result Value Range   Yeast Wet Prep HPF POC NONE SEEN  NONE SEEN   Trich, Wet Prep FEW (*) NONE SEEN   Clue Cells Wet Prep HPF POC FEW (*) NONE SEEN   WBC, Wet Prep HPF POC FEW (*) NONE SEEN  CBC     Status: Abnormal   Collection Time    01/24/13  4:03 PM      Result Value Range   WBC 8.7  4.0 - 10.5 K/uL   RBC 4.15  3.87 - 5.11 MIL/uL   Hemoglobin 11.6 (*) 12.0 - 15.0 g/dL   HCT 16.1 (*) 09.6 - 04.5 %   MCV 85.5  78.0 - 100.0 fL   MCH 28.0  26.0 - 34.0 pg   MCHC 32.7  30.0 - 36.0 g/dL   RDW 40.9  81.1 - 91.4 %   Platelets 275  150 - 400 K/uL     Assessment and Plan  A:  22 y.o. sexually active female, no contraception, doesn't desire pregnancy- neg preg test today  Heavy menses w/ normal H/H  BV  Trichomonas   P:  D/C home  Rx Metronidazole 500mg  BID x 7d for BV and trichomonas  Partner to go to HD or PCP to be treated for trich- no sex until at least 1wk since both treated  Discussed use of contraception to prevent unwanted pregnancy- doesn't want hormonal agent, to use condoms  Condoms for STI prevention  F/U w/ gyn of choice if heavy menses continues/worsens     Marge Duncans 01/24/2013, 3:28 PM

## 2013-01-27 LAB — GC/CHLAMYDIA PROBE AMP: CT Probe RNA: POSITIVE — AB

## 2013-01-30 ENCOUNTER — Encounter (HOSPITAL_COMMUNITY): Payer: Self-pay | Admitting: *Deleted

## 2013-01-30 ENCOUNTER — Emergency Department (HOSPITAL_COMMUNITY): Payer: Self-pay

## 2013-01-30 ENCOUNTER — Emergency Department (HOSPITAL_COMMUNITY)
Admission: EM | Admit: 2013-01-30 | Discharge: 2013-01-30 | Disposition: A | Discharge status: Home / Self Care | Payer: Self-pay | Attending: Emergency Medicine | Admitting: Emergency Medicine

## 2013-01-30 DIAGNOSIS — Y939 Activity, unspecified: Secondary | ICD-10-CM | POA: Insufficient documentation

## 2013-01-30 DIAGNOSIS — S20212A Contusion of left front wall of thorax, initial encounter: Secondary | ICD-10-CM

## 2013-01-30 DIAGNOSIS — S20219A Contusion of unspecified front wall of thorax, initial encounter: Secondary | ICD-10-CM | POA: Insufficient documentation

## 2013-01-30 DIAGNOSIS — Y929 Unspecified place or not applicable: Secondary | ICD-10-CM | POA: Insufficient documentation

## 2013-01-30 DIAGNOSIS — IMO0002 Reserved for concepts with insufficient information to code with codable children: Secondary | ICD-10-CM | POA: Insufficient documentation

## 2013-01-30 MED ORDER — HYDROCODONE-ACETAMINOPHEN 5-325 MG PO TABS
1.0000 | ORAL_TABLET | Freq: Once | ORAL | Status: AC
  Administered 2013-01-30: 1 via ORAL
  Filled 2013-01-30: qty 1

## 2013-01-30 MED ORDER — NAPROXEN 500 MG PO TABS: 500.0000 mg | ORAL_TABLET | Freq: Two times a day (BID) | ORAL | Status: DC

## 2013-01-30 MED ORDER — HYDROCODONE-ACETAMINOPHEN 5-325 MG PO TABS: ORAL_TABLET | ORAL | Status: DC

## 2013-01-30 NOTE — ED Notes (Signed)
Pt states couple days ago got hit w/ a paint ball in R side, complaining of R rib cage pain, states hurts to lay on it, take a deep breath or stretch.

## 2013-01-30 NOTE — ED Provider Notes (Signed)
History    This chart was scribed for non-physician practitioner Renne Crigler, PA-C, working with Sunnie Nielsen, MD by Donne Anon, ED Scribe. This patient was seen in room WTR7/WTR7 and the patient's care was started at 2206.  CSN: [id number] Arrival date & time 01/30/13  2152  First MD Initiated Contact with Patient 01/30/13 2206     Chief Complaint  Patient presents with  . rib cage pain     The history is provided by the patient. No language interpreter was used.   HPI Comments: Amanda Rubio is a 22 y.o. female who presents to the Emergency Department complaining of 2 days of sudden onset, gradually worsening, constant right rib cage pain that began when she was shot with a paint ball on the right side.the pain is worse with deep breaths, stretching, or laying on her right side. She has tried ibuprofen and Aleve with little relief.   Past Medical History  Diagnosis Date  . Medical history non-contributory    Past Surgical History  Procedure Laterality Date  . Bunionectomy    . Wisdom tooth extraction    . Breast surgery     No family history on file. History  Substance Use Topics  . Smoking status: Never Smoker   . Smokeless tobacco: Not on file  . Alcohol Use: Yes     Comment: very rare   OB History   Grav Para Term Preterm Abortions TAB SAB Ect Mult Living   1 1 1       1      Review of Systems  Constitutional: Negative for fever.  HENT: Negative for sore throat and rhinorrhea.   Eyes: Negative for redness.  Respiratory: Negative for cough.   Cardiovascular: Positive for chest pain (chest wall).  Gastrointestinal: Negative for nausea, vomiting, abdominal pain and diarrhea.  Genitourinary: Negative for dysuria.  Musculoskeletal: Negative for myalgias and arthralgias.  Skin: Negative for rash.  Neurological: Negative for headaches.    Allergies  Review of patient's allergies indicates no known allergies.  Home Medications   Current Outpatient Rx  Name   Route  Sig  Dispense  Refill  . ibuprofen (ADVIL,MOTRIN) 200 MG tablet   Oral   Take 800 mg by mouth every 6 (six) hours as needed for pain (pain).         . metroNIDAZOLE (FLAGYL) 500 MG tablet   Oral   Take 1 tablet (500 mg total) by mouth 2 (two) times daily. X 7 days   14 tablet   0   . naproxen (NAPROSYN) 250 MG tablet   Oral   Take 250 mg by mouth 2 (two) times daily with a meal.          BP 141/84  Pulse 103  Temp(Src) 99.9 F (37.7 C) (Oral)  Resp 20  SpO2 100%  LMP 01/02/2013  Physical Exam  Nursing note and vitals reviewed. Constitutional: She appears well-developed and well-nourished. No distress.  HENT:  Head: Normocephalic and atraumatic.  Eyes: Conjunctivae are normal.  Neck: Normal range of motion. Neck supple. No tracheal deviation present.  Cardiovascular: Normal rate.   Pulmonary/Chest: Effort normal and breath sounds normal. No respiratory distress. She has no wheezes. She has no rales. She exhibits tenderness.  Musculoskeletal: Normal range of motion.  Right inferior lateral rib tenderness. No skin trauma or bruising.   Neurological: She is alert.  Skin: Skin is warm and dry.  Psychiatric: She has a normal mood and affect. Her behavior  is normal.    ED Course  Procedures (including critical care time) DIAGNOSTIC STUDIES: Oxygen Saturation is 100% on RA, normal by my interpretation.    COORDINATION OF CARE: 10:33 PM Discussed treatment plan which includes CXR, antiinflammatories and pain medication with pt at bedside and pt agreed to plan.    Labs Reviewed - No data to display Dg Chest 2 View  01/30/2013   *RADIOLOGY REPORT*  Clinical Data: Pain to bowel injury.  Right lateral lower rib pain. Anterior rib pain.  CHEST - 2 VIEW  Comparison: None.  Findings: Heart and mediastinal contours are within normal limits. No focal opacities or effusions.  No acute bony abnormality.  No visible rib fracture.  No pneumothorax.  IMPRESSION: Negative.    Original Report Authenticated By: Charlett Nose, M.D.   1. Chest wall contusion, left, initial encounter    Pt informed of x-ray results. Will provide pain control. Patient urged to return with worsening symptoms or other concerns. Patient verbalized understanding and agrees with plan.   Patient counseled on use of narcotic pain medications. Counseled not to combine these medications with others containing tylenol. Urged not to drink alcohol, drive, or perform any other activities that requires focus while taking these medications. The patient verbalizes understanding and agrees with the plan.    MDM  Chest wall contusion after being hit with paintball. CXR neg. Pt appears well, no resp distress.   I personally performed the services described in this documentation, which was scribed in my presence. The recorded information has been reviewed and is accurate.    Renne Crigler, PA-C 01/31/13 1719

## 2013-01-31 NOTE — ED Provider Notes (Signed)
Medical screening examination/treatment/procedure(s) were performed by non-physician practitioner and as supervising physician I was immediately available for consultation/collaboration.  Sunnie Nielsen, MD 01/31/13 [phone]

## 2013-02-04 ENCOUNTER — Encounter (HOSPITAL_COMMUNITY): Payer: Self-pay

## 2013-02-04 ENCOUNTER — Inpatient Hospital Stay (HOSPITAL_COMMUNITY)
Admission: AD | Admit: 2013-02-04 | Discharge: 2013-02-04 | Disposition: A | Discharge status: Home / Self Care | Payer: Self-pay | Source: Ambulatory Visit | Attending: Obstetrics & Gynecology | Admitting: Obstetrics & Gynecology

## 2013-02-04 DIAGNOSIS — A088 Other specified intestinal infections: Secondary | ICD-10-CM | POA: Insufficient documentation

## 2013-02-04 DIAGNOSIS — S20219A Contusion of unspecified front wall of thorax, initial encounter: Secondary | ICD-10-CM | POA: Insufficient documentation

## 2013-02-04 DIAGNOSIS — A5619 Other chlamydial genitourinary infection: Secondary | ICD-10-CM | POA: Insufficient documentation

## 2013-02-04 DIAGNOSIS — R079 Chest pain, unspecified: Secondary | ICD-10-CM | POA: Insufficient documentation

## 2013-02-04 DIAGNOSIS — A749 Chlamydial infection, unspecified: Secondary | ICD-10-CM

## 2013-02-04 DIAGNOSIS — S20211D Contusion of right front wall of thorax, subsequent encounter: Secondary | ICD-10-CM

## 2013-02-04 DIAGNOSIS — A084 Viral intestinal infection, unspecified: Secondary | ICD-10-CM

## 2013-02-04 DIAGNOSIS — E876 Hypokalemia: Secondary | ICD-10-CM | POA: Insufficient documentation

## 2013-02-04 DIAGNOSIS — R1013 Epigastric pain: Secondary | ICD-10-CM | POA: Insufficient documentation

## 2013-02-04 DIAGNOSIS — N739 Female pelvic inflammatory disease, unspecified: Secondary | ICD-10-CM | POA: Insufficient documentation

## 2013-02-04 DIAGNOSIS — Y9229 Other specified public building as the place of occurrence of the external cause: Secondary | ICD-10-CM | POA: Insufficient documentation

## 2013-02-04 LAB — COMPREHENSIVE METABOLIC PANEL
Alkaline Phosphatase: 55 U/L (ref 39–117)
BUN: 10 mg/dL (ref 6–23)
CO2: 24 mEq/L (ref 19–32)
Chloride: 97 mEq/L (ref 96–112)
GFR calc Af Amer: >90 mL/min (ref >90)
Glucose, Bld: 101 mg/dL — ABNORMAL HIGH (ref 70–99)
Potassium: 3.3 mEq/L — ABNORMAL LOW (ref 3.5–5.1)
Total Bilirubin: 0.3 mg/dL (ref 0.3–1.2)

## 2013-02-04 LAB — LIPASE, BLOOD: Lipase: 33 U/L (ref 11–59)

## 2013-02-04 LAB — CBC
HCT: 32 % — ABNORMAL LOW (ref 36.0–46.0)
Hemoglobin: 10.5 g/dL — ABNORMAL LOW (ref 12.0–15.0)
WBC: 12.3 10*3/uL — ABNORMAL HIGH (ref 4.0–10.5)

## 2013-02-04 MED ORDER — CYCLOBENZAPRINE HCL 10 MG PO TABS
10.0000 mg | ORAL_TABLET | Freq: Once | ORAL | Status: AC
  Administered 2013-02-04: 10 mg via ORAL
  Filled 2013-02-04: qty 1

## 2013-02-04 MED ORDER — AZITHROMYCIN 250 MG PO TABS
1000.0000 mg | ORAL_TABLET | Freq: Once | ORAL | Status: AC
  Administered 2013-02-04: 1000 mg via ORAL
  Filled 2013-02-04: qty 4

## 2013-02-04 MED ORDER — PROMETHAZINE HCL 25 MG PO TABS: 25.0000 mg | ORAL_TABLET | Freq: Four times a day (QID) | ORAL | Status: DC | PRN

## 2013-02-04 MED ORDER — CYCLOBENZAPRINE HCL 10 MG PO TABS: 10.0000 mg | ORAL_TABLET | Freq: Two times a day (BID) | ORAL | Status: DC | PRN

## 2013-02-04 MED ORDER — POTASSIUM CHLORIDE ER 10 MEQ PO TBCR: 10.0000 meq | EXTENDED_RELEASE_TABLET | Freq: Two times a day (BID) | ORAL | Status: DC

## 2013-02-04 NOTE — MAU Note (Signed)
Pt reports pain in abd when she takes a deep breath and when she tries to lay down.

## 2013-02-04 NOTE — MAU Provider Note (Signed)
History     CSN: [id number]  Arrival date and time: 02/04/13 4098   First Provider Initiated Contact with Patient 02/04/13 2016      Chief Complaint  Patient presents with  . Abdominal Pain   HPI Amanda Rubio is a 22 y.o. G1P1001 who presents to MAU today with right-sided chest wall pain and epigastric pain. The patient was seen a little over 1 week ago at [facility] for the same pain. She describes the pain as sharp and rates it at 10/10. The pain is worse with deep breathing, ambulation, laying flat or bending. The patient states that she was hit with a paintball in the area of most pain on her right side just prior to symptom onset. She denies swelling or bruising. She has also had N/V/D x 2 days. She denies fever, heartburn, UTI symptoms, lower abdominal pain, vaginal bleeding or discharge. She was recently diagnosed with trichomonas and chlamydia. She is taking Flagyl, but missed her appointment at [facility] for chlamydia treatment because of this pain.   OB History   Grav Para Term Preterm Abortions TAB SAB Ect Mult Living   1 1 1       1       Past Medical History  Diagnosis Date  . Medical history non-contributory     Past Surgical History  Procedure Laterality Date  . Bunionectomy    . Wisdom tooth extraction    . Breast surgery      No family history on file.  History  Substance Use Topics  . Smoking status: Never Smoker   . Smokeless tobacco: Not on file  . Alcohol Use: Yes     Comment: very rare    Allergies: No Known Allergies  Prescriptions prior to admission  Medication Sig Dispense Refill  . HYDROcodone-acetaminophen (NORCO/VICODIN) 5-325 MG per tablet Take 1-2 tablets every 6 hours as needed for severe pain  8 tablet  0  . ibuprofen (ADVIL,MOTRIN) 200 MG tablet Take 800 mg by mouth every 6 (six) hours as needed for pain (pain).      . metroNIDAZOLE (FLAGYL) 500 MG tablet Take 1 tablet (500 mg total) by mouth 2 (two) times daily. X 7 days  14 tablet  0  .  naproxen (NAPROSYN) 500 MG tablet Take 1 tablet (500 mg total) by mouth 2 (two) times daily.  20 tablet  0    Review of Systems  Constitutional: Negative for fever and malaise/fatigue.  Cardiovascular: Positive for chest pain.  Gastrointestinal: Positive for nausea, vomiting, abdominal pain and diarrhea. Negative for constipation.  Genitourinary: Negative for dysuria, urgency and frequency.       Neg - vaginal bleeding, discharge   Physical Exam   Blood pressure 126/69, pulse 113, temperature 99.4 F (37.4 C), temperature source Oral, resp. rate 20, height 5\' 8"  (1.727 m), weight 193 lb (87.544 kg), last menstrual period 01/22/2013, SpO2 98.00%.  Physical Exam  Constitutional: She is oriented to person, place, and time. She appears well-developed and well-nourished. No distress.  HENT:  Head: Normocephalic and atraumatic.  Cardiovascular: Normal rate, regular rhythm and normal heart sounds.   Respiratory: Effort normal and breath sounds normal. No respiratory distress.  GI: Soft. Bowel sounds are normal. She exhibits no distension and no mass. There is tenderness (moderate tenderness to palpation of the epigastric region). There is no rebound and no guarding.  Neurological: She is alert and oriented to person, place, and time.  Skin: Skin is warm and dry. No  erythema.  Psychiatric: She has a normal mood and affect.   Results for orders placed during the hospital encounter of 02/04/13 (from the past 24 hour(s))  CBC     Status: Abnormal   Collection Time    02/04/13  8:35 PM      Result Value Range   WBC 12.3 (*) 4.0 - 10.5 K/uL   RBC 3.83 (*) 3.87 - 5.11 MIL/uL   Hemoglobin 10.5 (*) 12.0 - 15.0 g/dL   HCT 16.1 (*) 09.6 - 04.5 %   MCV 83.6  78.0 - 100.0 fL   MCH 27.4  26.0 - 34.0 pg   MCHC 32.8  30.0 - 36.0 g/dL   RDW 40.9  81.1 - 91.4 %   Platelets 494 (*) 150 - 400 K/uL  COMPREHENSIVE METABOLIC PANEL     Status: Abnormal   Collection Time    02/04/13  8:35 PM      Result  Value Range   Sodium 133 (*) 135 - 145 mEq/L   Potassium 3.3 (*) 3.5 - 5.1 mEq/L   Chloride 97  96 - 112 mEq/L   CO2 24  19 - 32 mEq/L   Glucose, Bld 101 (*) 70 - 99 mg/dL   BUN 10  6 - 23 mg/dL   Creatinine, Ser 7.82  0.50 - 1.10 mg/dL   Calcium 9.1  8.4 - 95.6 mg/dL   Total Protein 7.7  6.0 - 8.3 g/dL   Albumin 3.1 (*) 3.5 - 5.2 g/dL   AST 17  0 - 37 U/L   ALT 12  0 - 35 U/L   Alkaline Phosphatase 55  39 - 117 U/L   Total Bilirubin 0.3  0.3 - 1.2 mg/dL   GFR calc non Af Amer >90  >90 mL/min   GFR calc Af Amer >90  >90 mL/min  LIPASE, BLOOD     Status: None   Collection Time    02/04/13  8:35 PM      Result Value Range   Lipase 33  11 - 59 U/L  AMYLASE     Status: None   Collection Time    02/04/13  8:35 PM      Result Value Range   Amylase 72  0 - 105 U/L     MAU Course  Procedures None  MDM CBC, CMP, Amylase and Lipase today 10 mg Flexeril for chest wall pain 1 G zithromax for treatment of recently diagnosed Chlamydia  Patient reports some improvement in symptoms with Flexeril  Assessment and Plan  A: Chest wall contusion from paintball injury Hypokalemia Acute viral gastroenteritis Chlamydia  P: Discharge home Rx for K-Dur, Flexeril and phenergan sent to patient's pharmacy Patient advised to moderate activity while right sided pain is still present Patient given list of area PCPs for follow-up if symptoms continue or worsen Patient may return to MAU as needed  Freddi Starr, PA-C  02/04/2013, 10:10 PM

## 2013-08-20 ENCOUNTER — Encounter (HOSPITAL_COMMUNITY): Payer: Self-pay | Admitting: Radiology

## 2013-08-20 ENCOUNTER — Inpatient Hospital Stay (HOSPITAL_COMMUNITY)
Admission: AD | Admit: 2013-08-20 | Discharge: 2013-08-20 | Disposition: A | Discharge status: Home / Self Care | Payer: Self-pay | Source: Ambulatory Visit | Attending: Obstetrics & Gynecology | Admitting: Obstetrics & Gynecology

## 2013-08-20 ENCOUNTER — Inpatient Hospital Stay (HOSPITAL_COMMUNITY): Payer: BC Managed Care – PPO

## 2013-08-20 DIAGNOSIS — O9989 Other specified diseases and conditions complicating pregnancy, childbirth and the puerperium: Secondary | ICD-10-CM

## 2013-08-20 DIAGNOSIS — O26899 Other specified pregnancy related conditions, unspecified trimester: Secondary | ICD-10-CM

## 2013-08-20 DIAGNOSIS — O210 Mild hyperemesis gravidarum: Secondary | ICD-10-CM | POA: Insufficient documentation

## 2013-08-20 DIAGNOSIS — R109 Unspecified abdominal pain: Secondary | ICD-10-CM | POA: Insufficient documentation

## 2013-08-20 DIAGNOSIS — O99891 Other specified diseases and conditions complicating pregnancy: Secondary | ICD-10-CM | POA: Insufficient documentation

## 2013-08-20 LAB — URINALYSIS, ROUTINE W REFLEX MICROSCOPIC
Bilirubin Urine: NEGATIVE
GLUCOSE, UA: NEGATIVE mg/dL
Hgb urine dipstick: NEGATIVE
KETONES UR: NEGATIVE mg/dL
LEUKOCYTES UA: NEGATIVE
NITRITE: NEGATIVE
PH: 6 (ref 5.0–8.0)
Protein, ur: NEGATIVE mg/dL
Specific Gravity, Urine: 1.025 (ref 1.005–1.030)
Urobilinogen, UA: 0.2 mg/dL (ref 0.0–1.0)

## 2013-08-20 LAB — CBC
HCT: 35 % — ABNORMAL LOW (ref 36.0–46.0)
HEMOGLOBIN: 11.6 g/dL — AB (ref 12.0–15.0)
MCH: 27.8 pg (ref 26.0–34.0)
MCHC: 33.1 g/dL (ref 30.0–36.0)
MCV: 83.9 fL (ref 78.0–100.0)
PLATELETS: 282 10*3/uL (ref 150–400)
RBC: 4.17 MIL/uL (ref 3.87–5.11)
RDW: 14.9 % (ref 11.5–15.5)
WBC: 8.5 10*3/uL (ref 4.0–10.5)

## 2013-08-20 LAB — HCG, QUANTITATIVE, PREGNANCY: HCG, BETA CHAIN, QUANT, S: 1699 m[IU]/mL — AB (ref <5)

## 2013-08-20 LAB — WET PREP, GENITAL
Trich, Wet Prep: NONE SEEN
YEAST WET PREP: NONE SEEN

## 2013-08-20 LAB — POCT PREGNANCY, URINE: PREG TEST UR: POSITIVE — AB

## 2013-08-20 MED ORDER — ONDANSETRON HCL 4 MG PO TABS: 4.0000 mg | ORAL_TABLET | Freq: Four times a day (QID) | ORAL | Status: DC

## 2013-08-20 MED ORDER — PROMETHAZINE HCL 25 MG PO TABS: 25.0000 mg | ORAL_TABLET | Freq: Four times a day (QID) | ORAL | Status: DC | PRN

## 2013-08-20 NOTE — MAU Provider Note (Signed)
History     CSN: [id number]  Arrival date and time: 08/20/13 [id number]   First Provider Initiated Contact with Patient 08/20/13 2212      Chief Complaint  Patient presents with  . Possible Pregnancy  . Abdominal Cramping   HPI Amanda Rubio is a 23 y.o. G2P1001 at 6330w2d who presents to MAU today with complaint of lower abdominal cramping x 2-3 days. LMP was 07/14/13. Patient rates pain at 3/10 now. She has also had some N/V recently. She denies vaginal discharge, bleeding, diarrhea, constipation, UTI symptoms or fever. She had not taken a home pregnancy test because her "friend told her that stress could give a false positive and she has been stressed."   OB History   Grav Para Term Preterm Abortions TAB SAB Ect Mult Living   2 1 1       1       Past Medical History  Diagnosis Date  . Medical history non-contributory     Past Surgical History  Procedure Laterality Date  . Bunionectomy    . Wisdom tooth extraction    . Breast surgery      No family history on file.  History  Substance Use Topics  . Smoking status: Never Smoker   . Smokeless tobacco: Not on file  . Alcohol Use: Yes     Comment: very rare    Allergies: No Known Allergies  No prescriptions prior to admission    Review of Systems  Constitutional: Negative for fever and malaise/fatigue.  Gastrointestinal: Positive for nausea, vomiting and abdominal pain. Negative for diarrhea and constipation.  Genitourinary: Negative for dysuria, urgency and frequency.       Neg - vaginal bleeding, discharge  Neurological: Negative for dizziness and loss of consciousness.   Physical Exam   Blood pressure 124/77, pulse 86, temperature 100.1 F (37.8 C), temperature source Oral, resp. rate 86, weight 200 lb (90.719 kg), last menstrual period 07/14/2013.  Physical Exam  Constitutional: She is oriented to person, place, and time. She appears well-developed and well-nourished. No distress.  HENT:  Head:  Normocephalic and atraumatic.  Cardiovascular: Normal rate.   Respiratory: Effort normal.  GI: Soft. She exhibits no distension and no mass. There is no tenderness. There is no rebound and no guarding.  Genitourinary: Uterus is not enlarged (exam limited by maternal body habitus) and not tender. Cervix exhibits no motion tenderness, no discharge and no friability. Right adnexum displays no mass and no tenderness. Left adnexum displays no mass and no tenderness. No bleeding around the vagina. Vaginal discharge (scant thin, white discharge noted) found.  Neurological: She is alert and oriented to person, place, and time.  Skin: Skin is warm and dry. No erythema.  Psychiatric: She has a normal mood and affect.   Results for orders placed during the hospital encounter of 08/20/13 (from the past 24 hour(s))  URINALYSIS, ROUTINE W REFLEX MICROSCOPIC     Status: None   Collection Time    08/20/13  6:32 PM      Result Value Range   Color, Urine YELLOW  YELLOW   APPearance CLEAR  CLEAR   Specific Gravity, Urine 1.025  1.005 - 1.030   pH 6.0  5.0 - 8.0   Glucose, UA NEGATIVE  NEGATIVE mg/dL   Hgb urine dipstick NEGATIVE  NEGATIVE   Bilirubin Urine NEGATIVE  NEGATIVE   Ketones, ur NEGATIVE  NEGATIVE mg/dL   Protein, ur NEGATIVE  NEGATIVE mg/dL   Urobilinogen, UA 0.2  0.0 - 1.0 mg/dL   Nitrite NEGATIVE  NEGATIVE   Leukocytes, UA NEGATIVE  NEGATIVE  POCT PREGNANCY, URINE     Status: Abnormal   Collection Time    08/20/13  7:05 PM      Result Value Range   Preg Test, Ur POSITIVE (*) NEGATIVE  CBC     Status: Abnormal   Collection Time    08/20/13  8:44 PM      Result Value Range   WBC 8.5  4.0 - 10.5 K/uL   RBC 4.17  3.87 - 5.11 MIL/uL   Hemoglobin 11.6 (*) 12.0 - 15.0 g/dL   HCT 16.1 (*) 09.6 - 04.5 %   MCV 83.9  78.0 - 100.0 fL   MCH 27.8  26.0 - 34.0 pg   MCHC 33.1  30.0 - 36.0 g/dL   RDW 40.9  81.1 - 91.4 %   Platelets 282  150 - 400 K/uL  HCG, QUANTITATIVE, PREGNANCY     Status:  Abnormal   Collection Time    08/20/13  8:44 PM      Result Value Range   hCG, Beta Chain, Quant, S 1699 (*) <5 mIU/mL  WET PREP, GENITAL     Status: Abnormal   Collection Time    08/20/13 10:20 PM      Result Value Range   Yeast Wet Prep HPF POC NONE SEEN  NONE SEEN   Trich, Wet Prep NONE SEEN  NONE SEEN   Clue Cells Wet Prep HPF POC FEW (*) NONE SEEN   WBC, Wet Prep HPF POC FEW (*) NONE SEEN    US Ob Comp Less 14 Wks  08/20/2013   CLINICAL DATA:  Pregnant, cramping  EXAM: OBSTETRIC <14 WK [place] AND TRANSVAGINAL OB US  TECHNIQUE: Both transabdominal and transvaginal ultrasound examinations were performed for complete evaluation of the gestation as well as the maternal uterus, adnexal regions, and pelvic cul-de-sac. Transvaginal technique was performed to assess early pregnancy.  COMPARISON:  None.  FINDINGS: Intrauterine gestational sac: Visualized/normal in shape.  Yolk sac:  Not identified  Embryo:  Not identified  Cardiac Activity: Not applicable  MSD: 2.7 mm   4 w   5  d  [place] EDC: 04/24/2014  Maternal uterus/adnexae: No subchorionic hemorrhage. Normal sonographic appearance to the ovaries. Trace free fluid.  IMPRESSION: Single intrauterine gestational sac. Estimated age of 4 weeks 5 days by mean sac diameter. No yolk sac or embryo at this time, which may be due to the early timing of the pregnancy. Recommend correlation with quantitative HCG and short-term ultrasound follow-up.  Trace free fluid.   Electronically Signed   By: Jearld Lesch M.D.   On: 08/20/2013 21:28   US Ob Transvaginal  08/20/2013   CLINICAL DATA:  Pregnant, cramping  EXAM: OBSTETRIC <14 WK [place] AND TRANSVAGINAL OB US  TECHNIQUE: Both transabdominal and transvaginal ultrasound examinations were performed for complete evaluation of the gestation as well as the maternal uterus, adnexal regions, and pelvic cul-de-sac. Transvaginal technique was performed to assess early pregnancy.  COMPARISON:  None.  FINDINGS: Intrauterine  gestational sac: Visualized/normal in shape.  Yolk sac:  Not identified  Embryo:  Not identified  Cardiac Activity: Not applicable  MSD: 2.7 mm   4 w   5  d  [place] EDC: 04/24/2014  Maternal uterus/adnexae: No subchorionic hemorrhage. Normal sonographic appearance to the ovaries. Trace free fluid.  IMPRESSION: Single intrauterine gestational sac. Estimated age of 4 weeks 5 days by mean sac  diameter. No yolk sac or embryo at this time, which may be due to the early timing of the pregnancy. Recommend correlation with quantitative HCG and short-term ultrasound follow-up.  Trace free fluid.   Electronically Signed   By: Jearld Lesch M.D.   On: 08/20/2013 21:28    MAU Course  Procedures None  MDM +UPT CBC, quant hCG, wet prep, GC/Chlamydia and [place] today A+ blood type in Epic from previous episode  Assessment and Plan  A: IUGS at [redacted]w[redacted]d without YS or FP Abdominal pain in pregnancy, antepartum  P: Discharge home Bleeding/Ectopic precautions discussed Patient to return to MAU in 48 hours for follow-up labs Patient may return to MAU as needed sooner  Freddi Starr, PA-C  08/20/2013, 10:12 PM

## 2013-08-20 NOTE — MAU Note (Deleted)
When went to work this morning was nauseated. Got off work, bought a Aeronautical engineerpreg test and it was positive. Hadn't come on cycle yet.

## 2013-08-20 NOTE — Discharge Instructions (Signed)
Pregnancy, First Trimester °The first trimester is the first 3 months your baby is growing inside you. It is important to follow your doctor's instructions. °HOME CARE  °· Do not smoke. °· Do not drink alcohol. °· Only take medicine as told by your doctor. °· Exercise. °· Eat healthy foods. Eat regular, well-balanced meals. °· You can have sex (intercourse) if there are no other problems with the pregnancy. °· Things that help with morning sickness: °· Eat soda crackers before getting up in the morning. °· Eat 4 to 5 small meals rather than 3 large meals. °· Drink liquids between meals, not during meals. °· Go to all appointments as told. °· Take all vitamins or supplements as told by your doctor. °GET HELP RIGHT AWAY IF:  °· You develop a fever. °· You have a bad smelling fluid that is leaking from your vagina. °· There is bleeding from the vagina. °· You develop severe belly (abdominal) or back pain. °· You throw up (vomit) blood. It may look like coffee grounds. °· You lose more than 2 pounds in a week. °· You gain 5 pounds or more in a week. °· You gain more than 2 pounds in a week and you see puffiness (swelling) in your feet, ankles, or legs. °· You have severe dizziness or pass out (faint). °· You are around people who have German measles, chickenpox, or fifth disease. °· You have a headache, watery poop (diarrhea), pain with peeing (urinating), or cannot breath right. °Document Released: 12/28/2007 Document Revised: 10/03/2011 Document Reviewed: 12/28/2007 °ExitCare® Patient Information ©2014 [employer]. ° °

## 2013-08-20 NOTE — MAU Note (Signed)
On 01/21 noted bleeding, was expecting cycle. Has not seen any bleeding since. Started cramping 2 days ago. Did not do home test.

## 2013-08-21 LAB — GC/CHLAMYDIA PROBE AMP
CT Probe RNA: NEGATIVE
GC Probe RNA: NEGATIVE

## 2013-09-06 ENCOUNTER — Other Ambulatory Visit: Payer: Self-pay

## 2013-10-16 ENCOUNTER — Encounter (HOSPITAL_COMMUNITY): Payer: Self-pay | Admitting: *Deleted

## 2013-10-16 ENCOUNTER — Inpatient Hospital Stay (HOSPITAL_COMMUNITY)
Admission: AD | Admit: 2013-10-16 | Discharge: 2013-10-17 | Disposition: A | Discharge status: Home / Self Care | Payer: Self-pay | Source: Ambulatory Visit | Attending: Obstetrics & Gynecology | Admitting: Obstetrics & Gynecology

## 2013-10-16 DIAGNOSIS — R109 Unspecified abdominal pain: Secondary | ICD-10-CM | POA: Insufficient documentation

## 2013-10-16 DIAGNOSIS — O219 Vomiting of pregnancy, unspecified: Secondary | ICD-10-CM

## 2013-10-16 DIAGNOSIS — T148XXA Other injury of unspecified body region, initial encounter: Secondary | ICD-10-CM

## 2013-10-16 DIAGNOSIS — O210 Mild hyperemesis gravidarum: Secondary | ICD-10-CM | POA: Insufficient documentation

## 2013-10-16 DIAGNOSIS — O99891 Other specified diseases and conditions complicating pregnancy: Secondary | ICD-10-CM | POA: Insufficient documentation

## 2013-10-16 DIAGNOSIS — O9989 Other specified diseases and conditions complicating pregnancy, childbirth and the puerperium: Principal | ICD-10-CM

## 2013-10-16 LAB — URINALYSIS, ROUTINE W REFLEX MICROSCOPIC
Bilirubin Urine: NEGATIVE
GLUCOSE, UA: 250 mg/dL — AB
HGB URINE DIPSTICK: NEGATIVE
Ketones, ur: NEGATIVE mg/dL
LEUKOCYTES UA: NEGATIVE
Nitrite: NEGATIVE
PH: 6.5 (ref 5.0–8.0)
Protein, ur: NEGATIVE mg/dL
Specific Gravity, Urine: 1.015 (ref 1.005–1.030)
Urobilinogen, UA: 0.2 mg/dL (ref 0.0–1.0)

## 2013-10-16 NOTE — MAU Provider Note (Signed)
Chief Complaint: Abdominal Cramping   None    SUBJECTIVE HPI: Amanda Rubio is a 23 y.o. G2P1001 at 564w3d by LMP who presents with abdominal cramping. Pt states that for the past two days she has experienced several brief episodes of generalized non-radiating abdominal cramping located in various spots over her abdomen. She denies any vaginal bleeding or fluid leakage. No constipation. No dysuria, frequency, or urgency. Pt reports being adequately hydrated. Pt endorsed similar complaints in on 08/22/13 and was evaluated in the MAU for this. At this time, pt had an US that detected a single intrauterine gestational sac, but no yolk sac or embryo at this time, which may  be due to the early timing of the pregnancy. It was recommended that the pt have a correlation with quantitative HCG and short-term ultrasound follow-up, however the pt did not have this done.  Pt has not had any prenatal care up to this point d/t lack of insurance. She states that she has been under an increased amount of stress recently d/t the fact that she is moving out of town in the next week [employer]([place]). She works at a physically stressful job, as a Hydrographic surveyordaycare instructor.  Past Medical History  Diagnosis Date  . Medical history non-contributory    OB History  Gravida Para Term Preterm AB SAB TAB Ectopic Multiple Living  2 1 1       1     # Outcome Date GA Lbr Len/2nd Weight Sex Delivery Anes PTL Lv  2 CUR           1 TRM              Past Surgical History  Procedure Laterality Date  . Bunionectomy    . Wisdom tooth extraction    . Breast surgery     History   Social History  . Marital Status: Single    Spouse Name: N/A    Number of Children: N/A  . Years of Education: N/A   Occupational History  . Not on file.   Social History Main Topics  . Smoking status: Never Smoker   . Smokeless tobacco: Not on file  . Alcohol Use: No     Comment: very rare  . Drug Use: No  . Sexual Activity: Yes    Birth  Control/ Protection: None   Other Topics Concern  . Not on file   Social History Narrative  . No narrative on file   No current facility-administered medications on file prior to encounter.   No current outpatient prescriptions on file prior to encounter.   No Known Allergies  ROS: Pertinent items in HPI  OBJECTIVE Blood pressure 118/76, pulse 86, temperature 98.3 F (36.8 C), temperature source Oral, resp. rate 18, height 5\' 8"  (1.727 m), weight 197 lb 9.6 oz (89.631 kg), last menstrual period 07/14/2013. GENERAL: Well-developed, well-nourished female in no acute distress.  HEENT: Normocephalic HEART: normal rate RESP: normal effort ABDOMEN: Soft, non-tender EXTREMITIES: Nontender, no edema NEURO: Alert and oriented  LAB RESULTS Results for orders placed during the hospital encounter of 10/16/13 (from the past 24 hour(s))  URINALYSIS, ROUTINE W REFLEX MICROSCOPIC     Status: Abnormal   Collection Time    10/16/13  9:25 PM      Result Value Ref Range   Color, Urine YELLOW  YELLOW   APPearance CLEAR  CLEAR   Specific Gravity, Urine 1.015  1.005 - 1.030   pH 6.5  5.0 - 8.0  Glucose, UA 250 (*) NEGATIVE mg/dL   Hgb urine dipstick NEGATIVE  NEGATIVE   Bilirubin Urine NEGATIVE  NEGATIVE   Ketones, ur NEGATIVE  NEGATIVE mg/dL   Protein, ur NEGATIVE  NEGATIVE mg/dL   Urobilinogen, UA 0.2  0.0 - 1.0 mg/dL   Nitrite NEGATIVE  NEGATIVE   Leukocytes, UA NEGATIVE  NEGATIVE    IMAGING No results found.  MAU COURSE  ASSESSMENT 1. Muscle strain   2. Nausea and vomiting in pregnancy prior to [redacted] weeks gestation     PLAN Discharge home Follow-up Information   Follow up with THE [facility] [place] MATERNITY ADMISSIONS. (As needed, If symptoms worsen)    Contact information:   [address] [id number] [place] [address] [phone]      Schedule an appointment as soon as possible for a visit with OB in [facility].          Follow-up  Information   Follow up with THE [facility] OF  MATERNITY ADMISSIONS. (As needed, If symptoms worsen)    Contact information:   [address] [id number] [place] [address] [phone]      Schedule an appointment as soon as possible for a visit with OB in [facility].        Medication List    STOP taking these medications       ondansetron 4 MG tablet  Commonly known as:  ZOFRAN      TAKE these medications       promethazine 25 MG tablet  Commonly known as:  PHENERGAN  Take 1 tablet (25 mg total) by mouth every 6 (six) hours as needed for nausea or vomiting.         Unknown Foley, PA-S 10/17/2013  5:03 AM   0110 - Care assumed from [place], CNM and Allyne Gee, PAS  Patient reports frequent N/V and occasional loose stools the last of which was last week. She states that she has some PO intake, but minimal "stays in." Patient was given Rx for Zofran but is not taking it because of the warning for birth defects. The patient denies nausea now.  +FHT - 153 bpm with  Doppler The patient's pain is mid-abdomen and non-specific. There is no tenderness to palpation on exam. Normal bowel sounds.   Results for orders placed during the hospital encounter of 10/16/13 (from the past 24 hour(s))  URINALYSIS, ROUTINE W REFLEX MICROSCOPIC     Status: Abnormal   Collection Time    10/16/13  9:25 PM      Result Value Ref Range   Color, Urine YELLOW  YELLOW   APPearance CLEAR  CLEAR   Specific Gravity, Urine 1.015  1.005 - 1.030   pH 6.5  5.0 - 8.0   Glucose, UA 250 (*) NEGATIVE mg/dL   Hgb urine dipstick NEGATIVE  NEGATIVE   Bilirubin Urine NEGATIVE  NEGATIVE   Ketones, ur NEGATIVE  NEGATIVE mg/dL   Protein, ur NEGATIVE  NEGATIVE mg/dL   Urobilinogen, UA 0.2  0.0 - 1.0 mg/dL   Nitrite NEGATIVE  NEGATIVE   Leukocytes, UA NEGATIVE  NEGATIVE    A: Abdominal pain in pregnancy, antepartum  P: Discharge home Rx for Phenegan given to  patient Patient advised to start prenatal care with OB of choice in [place] Patient may return to MAU as needed or if her condition were to change or worsen  Freddi Starr, PA-C 10/17/2013 5:03 AM

## 2013-10-16 NOTE — MAU Note (Signed)
Pt G2 P1 at 13.3wks with mild cramping, denies bleeding.

## 2013-10-17 MED ORDER — PROMETHAZINE HCL 25 MG PO TABS: 25.0000 mg | ORAL_TABLET | Freq: Four times a day (QID) | ORAL | Status: DC | PRN

## 2013-10-17 NOTE — Discharge Instructions (Signed)
Hyperemesis Gravidarum Diet °Hyperemesis gravidarum is a severe form of morning sickness. It is characterized by frequent and severe vomiting. It happens during the first trimester of pregnancy. It may be caused by the rapid hormone changes that happen during pregnancy. It is associated with a 5% weight loss of pre-pregnancy weight. The hyperemesis diet may be used to lessen symptoms of nausea and vomiting. °EATING GUIDELINES °· Eat 5 to 6 small meals daily instead of 3 large meals. °· Avoid foods with strong smells. °· Avoid drinking 30 minutes before and after meals. °· Avoid fried or high-fat foods, such as butter and cream sauces. °· Starchy foods are usually well-tolerated, such as cereal, toast, bread, potatoes, pasta, rice, and pretzels. °· Eat crackers before you get out of bed in the morning. °· Avoid spicy foods. °· Ginger may help with nausea. Add ¼ tsp ginger to hot tea or choose ginger tea. °· Continue to take your prenatal vitamins as directed by your caregiver. °SAMPLE MEAL PLAN °Breakfast  °· ½ cup oatmeal °· 1 slice toast °· 1 tsp heart-healthy margarine °· 1 tsp jelly °· 1 scrambled egg °Midmorning Snack  °· 1 cup low-fat yogurt °Lunch  °· Plain ham sandwich °· Carrot or celery sticks °· 1 small apple °· 3 graham crackers °Midafternoon Snack  °· Cheese and crackers °Dinner °· 4 oz pork tenderloin °· 1 small baked potato °· 1 tsp margarine °· ½ cup broccoli °· ½ cup grapes °Evening Snack °· 1 cup pudding °Document Released: 05/08/2007 Document Revised: 10/03/2011 Document Reviewed: 12/11/2012 °ExitCare® Patient Information ©2014 [employer]. ° °

## 2013-11-20 ENCOUNTER — Telehealth: Payer: Self-pay | Admitting: *Deleted

## 2013-11-20 ENCOUNTER — Encounter: Payer: Self-pay | Admitting: Advanced Practice Midwife

## 2013-11-20 ENCOUNTER — Encounter: Payer: Self-pay | Admitting: *Deleted

## 2013-11-20 NOTE — Telephone Encounter (Signed)
Called patient to inform of missed appointment, no answer, left message for patient to return call to clinic.  Letter sent.

## 2013-11-21 ENCOUNTER — Encounter (HOSPITAL_COMMUNITY): Payer: Self-pay | Admitting: *Deleted

## 2013-11-21 ENCOUNTER — Inpatient Hospital Stay (HOSPITAL_COMMUNITY)
Admission: AD | Admit: 2013-11-21 | Discharge: 2013-11-21 | Disposition: A | Discharge status: Home / Self Care | Payer: BC Managed Care – PPO | Source: Ambulatory Visit | Attending: Obstetrics & Gynecology | Admitting: Obstetrics & Gynecology

## 2013-11-21 ENCOUNTER — Inpatient Hospital Stay (HOSPITAL_COMMUNITY): Payer: BC Managed Care – PPO

## 2013-11-21 DIAGNOSIS — O034 Incomplete spontaneous abortion without complication: Secondary | ICD-10-CM

## 2013-11-21 DIAGNOSIS — O071 Delayed or excessive hemorrhage following failed attempted termination of pregnancy: Secondary | ICD-10-CM | POA: Insufficient documentation

## 2013-11-21 LAB — CBC
HEMATOCRIT: 28.6 % — AB (ref 36.0–46.0)
HEMOGLOBIN: 9.2 g/dL — AB (ref 12.0–15.0)
MCH: 28 pg (ref 26.0–34.0)
MCHC: 32.2 g/dL (ref 30.0–36.0)
MCV: 86.9 fL (ref 78.0–100.0)
Platelets: 272 10*3/uL (ref 150–400)
RBC: 3.29 MIL/uL — ABNORMAL LOW (ref 3.87–5.11)
RDW: 13.8 % (ref 11.5–15.5)
WBC: 10.1 10*3/uL (ref 4.0–10.5)

## 2013-11-21 MED ORDER — HYDROCODONE-ACETAMINOPHEN 5-300 MG PO TABS: ORAL_TABLET | ORAL | Status: DC

## 2013-11-21 MED ORDER — MISOPROSTOL 200 MCG PO TABS
200.0000 ug | ORAL_TABLET | ORAL | Status: DC
Start: 2013-11-21 — End: 2013-11-21

## 2013-11-21 MED ORDER — HYDROCODONE-ACETAMINOPHEN 5-300 MG PO TABS
1.0000 | ORAL_TABLET | Freq: Four times a day (QID) | ORAL | Status: DC | PRN
Start: 2013-11-21 — End: 2013-12-02

## 2013-11-21 MED ORDER — MISOPROSTOL 200 MCG PO TABS: 200.0000 ug | ORAL_TABLET | ORAL | Status: DC

## 2013-11-21 NOTE — MAU Provider Note (Signed)
CC: Vaginal Bleeding    HPI Amanda Rubio is a 23 y.o. G2P1011 who had EAB 9 days ago in [place] at [redacted] wks GA. She presents via EMS with heavy vaginal bleeding that began at 1500 today. Saturated 6 pads in past 3 hrs. Some clots but no tissue noted. Denies abdominal pain. States procedure was uncomplicated. She had only light bleeding that had diminished to dark spotting prior to onset of bleeding today. No fever, chills, malaise. No abnormal or malodorous vaginal discharge. 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  Has follow up appt in [place] in mid-May. Blood type A positive.   Past Medical History  Diagnosis Date  . Medical history non-contributory     OB History  Gravida Para Term Preterm AB SAB TAB Ectopic Multiple Living  2 1 1  1  0    1    # Outcome Date GA Lbr Len/2nd Weight Sex Delivery Anes PTL Lv  2 ABT              Comments: System Generated. Please review and update pregnancy details.  1 TRM               Past Surgical History  Procedure Laterality Date  . Bunionectomy    . Wisdom tooth extraction    . Breast surgery      History   Social History  . Marital Status: Single    Spouse Name: N/A    Number of Children: N/A  . Years of Education: N/A   Occupational History  . Not on file.   Social History Main Topics  . Smoking status: Never Smoker   . Smokeless tobacco: Not on file  . Alcohol Use: No     Comment: very rare  . Drug Use: No  . Sexual Activity: Yes    Birth Control/ Protection: None   Other Topics Concern  . Not on file   Social History Narrative  . No narrative on file    No current facility-administered medications on file prior to encounter.   No current outpatient prescriptions on file prior to encounter.    No Known Allergies  ROS Pertinent items in HPI  PHYSICAL EXAM Filed Vitals:   11/21/13 1736  BP: 115/76  Pulse: 100  Temp: 99 F (37.2 C)  Resp: 18   General: Well nourished, well developed  female in no acute distress Cardiovascular: Normal rate Respiratory: Normal effort Abdomen: Soft, nontender Back: No CVAT Extremities: No edema Neurologic: Alert and oriented Speculum exam: NEFG with clitoral piercing; moderate BRB, several large clots removed from cx with swabs and sponge forceps, then ative bleeding lessened.  Bimanual exam: cervix ftp, no CMT; uterus 8-10wks size; no adnexal tenderness or masses   LAB RESULTS Results for orders placed during the hospital encounter of 11/21/13 (from the past 24 hour(s))  CBC     Status: Abnormal   Collection Time    11/21/13  6:16 PM      Result Value Ref Range   WBC 10.1  4.0 - 10.5 K/uL   RBC 3.29 (*) 3.87 - 5.11 MIL/uL   Hemoglobin 9.2 (*) 12.0 - 15.0 g/dL   HCT 16.128.6 (*) 09.636.0 - 04.546.0 %   MCV 86.9  78.0 - 100.0 fL   MCH 28.0  26.0 - 34.0 pg   MCHC 32.2  30.0 - 36.0 g/dL   RDW 40.913.8  81.111.5 - 91.415.5 %   Platelets 272  150 - 400  K/uL    IMAGING  CLINICAL DATA: Severe bleeding. LMP 07/14/2013.  EXAM:  TRANSABDOMINAL AND TRANSVAGINAL ULTRASOUND OF PELVIS  TECHNIQUE:  Both transabdominal and transvaginal ultrasound examinations of the  pelvis were performed. Transabdominal technique was performed for  global imaging of the pelvis including uterus, ovaries, adnexal  regions, and pelvic cul-de-sac. It was necessary to proceed with  endovaginal exam following the transabdominal exam to visualize the  endometrium and ovaries.  COMPARISON: Obstetric ultrasound 08/20/2013  FINDINGS:  Uterus  Measurements: 10.3 x 5.8 x 6.7 cm. Anteverted. No fibroids or other  mass visualized.  Endometrium  Thickness: Heterogeneous and thickened, measuring 2.5 cm thickness.  Prominent vascularity is seen within the heterogeneous and thickened  endometrium. No focal abnormality visualized.  Right ovary  Measurements: 2.3 x 1.5 x 2.3 cm. Normal appearance/no adnexal mass.  Left ovary  Measurements: Not visualized by transabdominal or transvaginal   imaging. No adnexal mass seen on the left.  Other findings  No free fluid.  IMPRESSION:  Retained products of conception. Thickened, heterogeneous, and  vascular endometrium.  Electronically Signed  By: Britta MccreedySusan Turner M.D.  On: 11/21/2013 19:06  MAU COURSE Kept NPO After removal of clots bleeding has slowed a lot> small BRB on pad At 1945 bleeding is scanty C/W Dr. Penne LashLeggett who saw pt and offered D&C or cytotec> pt elects cytotec  ASSESSMENT  1. Retained products of conception following abortion     PLAN Discharge home Instructed to return SOS for severe pain, fever, heavy bleeding   Medication List         Hydrocodone-Acetaminophen 5-300 MG Tabs  Commonly known as:  VICODIN  Take 1 tablet by mouth 4 (four) times daily as needed.     misoprostol 200 MCG tablet  Commonly known as:  CYTOTEC  Take 1 tablet (200 mcg total) by mouth 1 day or 1 dose. Place all 4 tabs deep in vagina at once       Follow-up Information   Follow up In 2 weeks. (Keep [place] )       Danae OrleansDeirdre C Sophiagrace [place], [place] 11/21/2013 5:58 PM

## 2013-11-21 NOTE — MAU Note (Signed)
Pt had TAB on 4/18 @ 16 weeks. Stated every went well. Has had bleeding like a light period since. Today after work she started bleeding heavily soaking pad every few minutes. Denies any pain or cramping called EMS.

## 2013-11-24 NOTE — MAU Provider Note (Signed)
Pt seen and examined.  Offered D&C and Cytotec.  Pt opted for Cytotec.  If not successful or heavy bleeding occurs, pt will need D&C.  Attestation of Attending Supervision of Advanced Practitioner (CNM/NP): Evaluation and management procedures were performed by the Advanced Practitioner under my supervision and collaboration. I have reviewed the Advanced Practitioner's note and chart, and I agree with the management and plan.  Lesly DukesKelly H Merton Wadlow 9:53 AM

## 2013-12-02 ENCOUNTER — Encounter (HOSPITAL_COMMUNITY): Payer: BC Managed Care – PPO | Admitting: Anesthesiology

## 2013-12-02 ENCOUNTER — Inpatient Hospital Stay (HOSPITAL_COMMUNITY): Payer: BC Managed Care – PPO | Admitting: Anesthesiology

## 2013-12-02 ENCOUNTER — Encounter (HOSPITAL_COMMUNITY)
Admission: AD | Disposition: A | Discharge status: Home / Self Care | Payer: Self-pay | Source: Ambulatory Visit | Attending: Family Medicine

## 2013-12-02 ENCOUNTER — Encounter (HOSPITAL_COMMUNITY): Payer: Self-pay | Admitting: General Practice

## 2013-12-02 ENCOUNTER — Ambulatory Visit (HOSPITAL_COMMUNITY)
Admission: AD | Admit: 2013-12-02 | Discharge: 2013-12-03 | Disposition: A | Discharge status: Home / Self Care | Payer: BC Managed Care – PPO | Source: Ambulatory Visit | Attending: Family Medicine | Admitting: Family Medicine

## 2013-12-02 ENCOUNTER — Inpatient Hospital Stay (HOSPITAL_COMMUNITY): Payer: BC Managed Care – PPO

## 2013-12-02 DIAGNOSIS — O071 Delayed or excessive hemorrhage following failed attempted termination of pregnancy: Secondary | ICD-10-CM | POA: Insufficient documentation

## 2013-12-02 DIAGNOSIS — O031 Delayed or excessive hemorrhage following incomplete spontaneous abortion: Secondary | ICD-10-CM

## 2013-12-02 DIAGNOSIS — D62 Acute posthemorrhagic anemia: Secondary | ICD-10-CM | POA: Diagnosis present

## 2013-12-02 HISTORY — PX: DILATION AND EVACUATION: SHX1459

## 2013-12-02 LAB — CBC WITH DIFFERENTIAL/PLATELET
BASOS ABS: 0 10*3/uL (ref 0.0–0.1)
Basophils Relative: 0 % (ref 0–1)
EOS ABS: 0.1 10*3/uL (ref 0.0–0.7)
EOS PCT: 1 % (ref 0–5)
HCT: 19.9 % — ABNORMAL LOW (ref 36.0–46.0)
Hemoglobin: 6.3 g/dL — CL (ref 12.0–15.0)
LYMPHS ABS: 2.6 10*3/uL (ref 0.7–4.0)
Lymphocytes Relative: 26 % (ref 12–46)
MCH: 26.7 pg (ref 26.0–34.0)
MCHC: 31.7 g/dL (ref 30.0–36.0)
MCV: 84.3 fL (ref 78.0–100.0)
Monocytes Absolute: 0.7 10*3/uL (ref 0.1–1.0)
Monocytes Relative: 7 % (ref 3–12)
Neutro Abs: 6.4 10*3/uL (ref 1.7–7.7)
Neutrophils Relative %: 65 % (ref 43–77)
PLATELETS: 378 10*3/uL (ref 150–400)
RBC: 2.36 MIL/uL — ABNORMAL LOW (ref 3.87–5.11)
RDW: 14.8 % (ref 11.5–15.5)
WBC: 9.8 10*3/uL (ref 4.0–10.5)

## 2013-12-02 LAB — PREPARE RBC (CROSSMATCH)

## 2013-12-02 LAB — HCG, QUANTITATIVE, PREGNANCY: hCG, Beta Chain, Quant, S: 166 m[IU]/mL — ABNORMAL HIGH (ref <5)

## 2013-12-02 LAB — ABO/RH: ABO/RH(D): A POS

## 2013-12-02 SURGERY — DILATION AND EVACUATION, UTERUS
Anesthesia: Monitor Anesthesia Care | Site: Vagina

## 2013-12-02 MED ORDER — FAMOTIDINE IN NACL 20-0.9 MG/50ML-% IV SOLN
20.0000 mg | Freq: Once | INTRAVENOUS | Status: AC
  Administered 2013-12-02: 20 mg via INTRAVENOUS
  Filled 2013-12-02: qty 50

## 2013-12-02 MED ORDER — HYDROCODONE-ACETAMINOPHEN 5-300 MG PO TABS: 1.0000 | ORAL_TABLET | Freq: Four times a day (QID) | ORAL | Status: DC | PRN

## 2013-12-02 MED ORDER — OXYCODONE HCL 5 MG/5ML PO SOLN: 5.0000 mg | Freq: Once | ORAL | Status: AC | PRN

## 2013-12-02 MED ORDER — OXYCODONE HCL 5 MG PO TABS: 5.0000 mg | ORAL_TABLET | Freq: Once | ORAL | Status: AC | PRN

## 2013-12-02 MED ORDER — BUPIVACAINE-EPINEPHRINE 0.25% -1:200000 IJ SOLN
INTRAMUSCULAR | Status: DC | PRN
  Administered 2013-12-02: 20 mL

## 2013-12-02 MED ORDER — OXYCODONE HCL 5 MG PO TABS: 5.0000 mg | ORAL_TABLET | Freq: Once | ORAL | Status: DC | PRN

## 2013-12-02 MED ORDER — MIDAZOLAM HCL 5 MG/5ML IJ SOLN
INTRAMUSCULAR | Status: DC | PRN
  Administered 2013-12-02: 2 mg via INTRAVENOUS

## 2013-12-02 MED ORDER — ONDANSETRON HCL 4 MG/2ML IJ SOLN
INTRAMUSCULAR | Status: DC | PRN
  Administered 2013-12-02: 4 mg via INTRAVENOUS

## 2013-12-02 MED ORDER — ACETAMINOPHEN 160 MG/5ML PO SOLN
325.0000 mg | ORAL | Status: DC | PRN
Start: 2013-12-02 — End: 2013-12-03

## 2013-12-02 MED ORDER — PUREFE PLUS 106-1 MG PO CAPS: 1.0000 | ORAL_CAPSULE | Freq: Two times a day (BID) | ORAL | Status: DC

## 2013-12-02 MED ORDER — FENTANYL CITRATE 0.05 MG/ML IJ SOLN
INTRAMUSCULAR | Status: DC | PRN
Start: 2013-12-02 — End: 2013-12-02
  Administered 2013-12-02 (×2): 50 ug via INTRAVENOUS

## 2013-12-02 MED ORDER — ACETAMINOPHEN 325 MG PO TABS: 325.0000 mg | ORAL_TABLET | ORAL | Status: DC | PRN

## 2013-12-02 MED ORDER — PROMETHAZINE HCL 25 MG/ML IJ SOLN: 6.2500 mg | INTRAMUSCULAR | Status: DC | PRN

## 2013-12-02 MED ORDER — LIDOCAINE HCL (CARDIAC) 20 MG/ML IV SOLN
INTRAVENOUS | Status: DC | PRN
  Administered 2013-12-02 (×2): 50 mg via INTRAVENOUS

## 2013-12-02 MED ORDER — LACTATED RINGERS IV BOLUS (SEPSIS)
1000.0000 mL | Freq: Once | INTRAVENOUS | Status: AC
  Administered 2013-12-02: 1000 mL via INTRAVENOUS

## 2013-12-02 MED ORDER — IBUPROFEN 600 MG PO TABS: 600.0000 mg | ORAL_TABLET | Freq: Four times a day (QID) | ORAL | Status: DC | PRN

## 2013-12-02 MED ORDER — FENTANYL CITRATE 0.05 MG/ML IJ SOLN: 25.0000 ug | INTRAMUSCULAR | Status: DC | PRN

## 2013-12-02 MED ORDER — LACTATED RINGERS IV SOLN
INTRAVENOUS | Status: DC | PRN
  Administered 2013-12-02: 22:00:00 via INTRAVENOUS

## 2013-12-02 MED ORDER — CITRIC ACID-SODIUM CITRATE 334-500 MG/5ML PO SOLN
30.0000 mL | Freq: Once | ORAL | Status: AC
  Administered 2013-12-02: 30 mL via ORAL
  Filled 2013-12-02: qty 15

## 2013-12-02 MED ORDER — PROPOFOL 10 MG/ML IV BOLUS
INTRAVENOUS | Status: DC | PRN
  Administered 2013-12-02 (×2): 30 mg via INTRAVENOUS
  Administered 2013-12-02 (×2): 20 mg via INTRAVENOUS

## 2013-12-02 SURGICAL SUPPLY — 21 items
CATH ROBINSON RED A/P 16FR (CATHETERS) ×2 IMPLANT
CLOTH BEACON ORANGE TIMEOUT ST (SAFETY) ×2 IMPLANT
DECANTER SPIKE VIAL GLASS SM (MISCELLANEOUS) ×2 IMPLANT
GLOVE BIOGEL PI IND STRL 7.0 (GLOVE) ×1 IMPLANT
GLOVE BIOGEL PI INDICATOR 7.0 (GLOVE) ×1
GLOVE ECLIPSE 7.0 STRL STRAW (GLOVE) ×4 IMPLANT
GOWN STRL REUS W/TWL LRG LVL3 (GOWN DISPOSABLE) ×5 IMPLANT
KIT BERKELEY 1ST TRIMESTER 3/8 (MISCELLANEOUS) ×2 IMPLANT
NDL SPNL 22GX3.5 QUINCKE BK (NEEDLE) ×1 IMPLANT
NEEDLE SPNL 22GX3.5 QUINCKE BK (NEEDLE) ×2 IMPLANT
NS IRRIG 1000ML POUR BTL (IV SOLUTION) ×2 IMPLANT
PACK VAGINAL MINOR WOMEN LF (CUSTOM PROCEDURE TRAY) ×2 IMPLANT
PAD OB MATERNITY 4.3X12.25 (PERSONAL CARE ITEMS) ×2 IMPLANT
PAD PREP 24X48 CUFFED NSTRL (MISCELLANEOUS) ×2 IMPLANT
SET BERKELEY SUCTION TUBING (SUCTIONS) ×2 IMPLANT
SYR CONTROL 10ML LL (SYRINGE) ×2 IMPLANT
TOWEL OR 17X24 6PK STRL BLUE (TOWEL DISPOSABLE) ×4 IMPLANT
VACURETTE 10 RIGID CVD (CANNULA) ×1 IMPLANT
VACURETTE 7MM CVD STRL WRAP (CANNULA) IMPLANT
VACURETTE 8 RIGID CVD (CANNULA) IMPLANT
VACURETTE 9 RIGID CVD (CANNULA) IMPLANT

## 2013-12-02 NOTE — Discharge Instructions (Signed)
Dilation and Curettage or Vacuum Curettage Dilation and curettage (D&C) and vacuum curettage are minor procedures. A D&C involves stretching (dilation) the cervix and scraping (curettage) the inside lining of the womb (uterus). During a D&C, tissue is gently scraped from the inside lining of the uterus. During a vacuum curettage, the lining and tissue in the uterus are removed with the use of gentle suction.  Curettage may be performed to either diagnose or treat a problem. As a diagnostic procedure, curettage is performed to examine tissues from the uterus. A diagnostic curettage may be performed for the following symptoms:   Irregular bleeding in the uterus.   Bleeding with the development of clots.   Spotting between menstrual periods.   Prolonged menstrual periods.   Bleeding after menopause.   No menstrual period (amenorrhea).   A change in size and shape of the uterus.  As a treatment procedure, curettage may be performed for the following reasons:   Removal of an IUD (intrauterine device).   Removal of retained placenta after giving birth. Retained placenta can cause an infection or bleeding severe enough to require transfusions.   Abortion.   Miscarriage.   Removal of polyps inside the uterus.   Removal of uncommon types of noncancerous lumps (fibroids).  LET YOUR HEALTH CARE PROVIDER KNOW ABOUT:   Any allergies you have.   All medicines you are taking, including vitamins, herbs, eye drops, creams, and over-the-counter medicines.   Previous problems you or members of your family have had with the use of anesthetics.   Any blood disorders you have.   Previous surgeries you have had.   Medical conditions you have. RISKS AND COMPLICATIONS  Generally, this is a safe procedure. However, as with any procedure, complications can occur. Possible complications include:  Excessive bleeding.   Infection of the uterus.   Damage to the cervix.    Development of scar tissue (adhesions) inside the uterus, later causing abnormal amounts of menstrual bleeding.   Complications from the general anesthetic, if a general anesthetic is used.   Putting a hole (perforation) in the uterus. This is rare.  BEFORE THE PROCEDURE   Eat and drink before the procedure only as directed by your health care provider.   Arrange for someone to take you home.  PROCEDURE  This procedure usually takes about 15 30 minutes.  You will be given one of the following:  A medicine that numbs the area in and around the cervix (local anesthetic).   A medicine to make you sleep through the procedure (general anesthetic).  You will lie on your back with your legs in stirrups.   A warm metal or plastic instrument (speculum) will be placed in your vagina to keep it open and to allow the health care provider to see the cervix.  There are two ways in which your cervix can be softened and dilated. These include:   Taking a medicine.   Having thin rods (laminaria) inserted into your cervix.   A curved tool (curette) will be used to scrape cells from the inside lining of the uterus. In some cases, gentle suction is applied with the curette. The curette will then be removed.  AFTER THE PROCEDURE   You will rest in the recovery area until you are stable and are ready to go home.   You may feel sick to your stomach (nauseous) or throw up (vomit) if you were given a general anesthetic.   You may have a sore throat if a   tube was placed in your throat during general anesthesia.   You may have light cramping and bleeding. This may last for 2 days to 2 weeks after the procedure.   Your uterus needs to make a new lining after the procedure. This may make your next period late. Document Released: 07/11/2005 Document Revised: 03/13/2013 Document Reviewed: 02/07/2013 [employer] Patient Information 2014 [employer].  

## 2013-12-02 NOTE — MAU Note (Signed)
Spoke to pt in private at side of lobby, placed 3 pads @ 6:15, able to walk without assistance and alert.  Instructed pt to let us know if she felt she was bleeding heavier before we got her in triage

## 2013-12-02 NOTE — MAU Note (Signed)
Patient states she terminated a pregnancy at about 16 weeks on 4-18. Was seen in MAU on 4-30 with bleeding and given Cytotec. Feels like they did not work well. States she started bleeding heavy and having abdominal pain again today. States she sat on the toilet for about one hour with bleeding and clots.

## 2013-12-02 NOTE — H&P (Signed)
Chief Complaint   Patient presents with   .  Vaginal Bleeding   .  Abdominal Pain   HPI  [name] is a [age] y.o. female 682P1011 who presents with heavy vaginal bleeding. Bleeding today x 1 hour with non-stop bleeding. The Pt had a termination in [place] on April [place]; a [redacted] week gestation. Pt presented on 4/30 via EMS with heavy vaginal bleeding; US showed retained products of conception. Pt was offered cytotec or D/C by Dr. Penne LashLeggett; pt chose cytotec. After taking cytotec she does not feel like she had heavier bleeding or increased pain. Since the TAB she has not stopped bleeding.  A positive blood type.   OB History    Grav  Para  Term  Preterm  Abortions  TAB  SAB  Ect  Mult  Living    2  1  1   1    0    1      Past Medical History   Diagnosis  Date   .  Medical history non-contributory     Past Surgical History   Procedure  Laterality  Date   .  Bunionectomy     .  Wisdom tooth extraction     .  Breast surgery     History reviewed. No pertinent family history.   History   Substance Use Topics   .  Smoking status:  Never Smoker   .  Smokeless tobacco:  Not on file   .  Alcohol Use:  No      Comment: very rare    Allergies: No Known Allergies   Prescriptions prior to admission   Medication  Sig  Dispense  Refill   .  Hydrocodone-Acetaminophen (VICODIN) 5-300 MG TABS  Take 1 tablet by mouth 4 (four) times daily as needed.  10 each  0   .  misoprostol (CYTOTEC) 200 MCG tablet  Take 1 tablet (200 mcg total) by mouth 1 day or 1 dose. Place all 4 tabs deep in vagina at once  4 tablet  0    Results for orders placed during the hospital encounter of 12/02/13 (from the past 48 hour(s))   CBC WITH DIFFERENTIAL Status: Abnormal    Collection Time    12/02/13 6:46 PM   Result  Value  Ref Range    WBC  9.8  4.0 - 10.5 K/uL    RBC  2.36 (*)  3.87 - 5.11 MIL/uL    Hemoglobin  6.3 (*)  12.0 - 15.0 g/dL    Comment:  REPEATED TO VERIFY     CRITICAL RESULT CALLED TO, READ BACK BY  AND VERIFIED WITH:     ROBB,M AT 1910 ON 12/02/13 BY HAGGINSC    HCT  19.9 (*)  36.0 - 46.0 %    MCV  84.3  78.0 - 100.0 fL    MCH  26.7  26.0 - 34.0 pg    MCHC  31.7  30.0 - 36.0 g/dL    RDW  16.114.8  09.611.5 - 04.515.5 %    Platelets  378  150 - 400 K/uL    Neutrophils Relative %  65  43 - 77 %    Neutro Abs  6.4  1.7 - 7.7 K/uL    Lymphocytes Relative  26  12 - 46 %    Lymphs Abs  2.6  0.7 - 4.0 K/uL    Monocytes Relative  7  3 - 12 %    Monocytes  Absolute  0.7  0.1 - 1.0 K/uL    Eosinophils Relative  1  0 - 5 %    Eosinophils Absolute  0.1  0.0 - 0.7 K/uL    Basophils Relative  0  0 - 1 %    Basophils Absolute  0.0  0.0 - 0.1 K/uL   HCG, QUANTITATIVE, PREGNANCY Status: Abnormal    Collection Time    12/02/13 6:46 PM   Result  Value  Ref Range    hCG, Beta Chain, Quant, S  166 (*)  <5 mIU/mL    Comment:      GEST. AGE CONC. (mIU/mL)     <=1 WEEK 5 - 50     2 WEEKS 50 - 500     3 WEEKS 100 - 10,000     4 WEEKS 1,000 - 30,000     5 WEEKS 3,500 - 115,000     6-8 WEEKS 12,000 - 270,000     12 WEEKS 15,000 - 220,000         FEMALE AND NON-PREGNANT FEMALE:     LESS THAN 5 mIU/mL   TYPE AND SCREEN Status: None    Collection Time    12/02/13 7:21 PM   Result  Value  Ref Range    ABO/RH(D)  A POS     Antibody Screen  NEG     Sample Expiration  12/05/2013     Review of Systems  Constitutional: Negative for fever and chills.  Gastrointestinal: Positive for abdominal pain (occasional cramping ). Negative for nausea and vomiting.  Neurological: Negative for weakness and headaches.   Physical Exam   Blood pressure 121/70, pulse 99, temperature 98.8 F (37.1 C), temperature source Oral, resp. rate 16, height 5' 7.5" (1.715 m), weight 85.639 kg (188 lb 12.8 oz), SpO2 100.00%, unknown if currently breastfeeding.  Physical Exam  Constitutional: She is oriented to person, place, and time. She appears well-developed and well-nourished. No distress.  HENT:  Head: Normocephalic.  Eyes: Pupils  are equal, round, and reactive to light.  Neck: Normal range of motion.  Respiratory: Effort normal.  GI: Soft. She exhibits no distension. There is no tenderness.  Genitourinary:  Scant amount of bright red blood on patients pad  Musculoskeletal: Normal range of motion.  Neurological: She is alert and oriented to person, place, and time.  Skin: She is not diaphoretic. There is pallor.  Psychiatric: Her behavior is normal.   Assessment and Plan    Patient Active Problem List   Diagnosis Date Noted  . Acute blood loss anemia 12/02/2013  . Retained products of conception with hemorrhage 12/02/2013   To OR for Dilation and Curettage.--Risks include but are not limited to bleeding, infection, injury to surrounding structures, including bowel, bladder and ureters, blood clots, and death.  Likelihood of success is high.

## 2013-12-02 NOTE — Anesthesia Preprocedure Evaluation (Signed)
Anesthesia Evaluation  Patient identified by MRN, date of birth, ID band Patient awake    Reviewed: Allergy & Precautions, H&P , NPO status , Patient's Chart, lab work & pertinent test results  History of Anesthesia Complications Negative for: history of anesthetic complications  Airway Mallampati: II TM Distance: >3 FB Neck ROM: Full    Dental  (+) Teeth Intact   Pulmonary neg pulmonary ROS,  breath sounds clear to auscultation        Cardiovascular negative cardio ROS  Rhythm:Regular     Neuro/Psych negative neurological ROS     GI/Hepatic negative GI ROS, Neg liver ROS,   Endo/Other  negative endocrine ROS  Renal/GU negative Renal ROS     Musculoskeletal   Abdominal   Peds  Hematology  (+) anemia ,   Anesthesia Other Findings   Reproductive/Obstetrics Retained products of conception with bleeding                           Anesthesia Physical Anesthesia Plan  ASA: II  Anesthesia Plan: MAC   Post-op Pain Management:    Induction: Intravenous  Airway Management Planned: Simple Face Mask and Natural Airway  Additional Equipment: None  Intra-op Plan:   Post-operative Plan:   Informed Consent: I have reviewed the patients History and Physical, chart, labs and discussed the procedure including the risks, benefits and alternatives for the proposed anesthesia with the patient or authorized representative who has indicated his/her understanding and acceptance.   Dental advisory given  Plan Discussed with: CRNA and Surgeon  Anesthesia Plan Comments:         Anesthesia Quick Evaluation

## 2013-12-02 NOTE — MAU Provider Note (Signed)
History     CSN: [id number]  Arrival date and time: 12/02/13 [id number]   First Provider Initiated Contact with Patient 12/02/13 1939      Chief Complaint  Patient presents with  . Vaginal Bleeding  . Abdominal Pain   HPI  [name] is a 23 y.o. female 502P1011 who presents with heavy vaginal bleeding. The Pt had a termination in [place] on April 21; a [redacted] week gestation. Pt presented on 4/30 via EMS with heavy vaginal bleeding; US showed retained products of conception. Pt was offered cytotec or D/C by Dr. Penne LashLeggett; pt chose cytotec. After taking cytotec she does not feel like she had heavier bleeding or increased pain. Since the TAB she has not stopped bleeding.  A positive blood type.   OB History   Grav Para Term Preterm Abortions TAB SAB Ect Mult Living   2 1 1  1   0   1      Past Medical History  Diagnosis Date  . Medical history non-contributory     Past Surgical History  Procedure Laterality Date  . Bunionectomy    . Wisdom tooth extraction    . Breast surgery      History reviewed. No pertinent family history.  History  Substance Use Topics  . Smoking status: Never Smoker   . Smokeless tobacco: Not on file  . Alcohol Use: No     Comment: very rare    Allergies: No Known Allergies  Prescriptions prior to admission  Medication Sig Dispense Refill  . Hydrocodone-Acetaminophen (VICODIN) 5-300 MG TABS Take 1 tablet by mouth 4 (four) times daily as needed.  10 each  0  . misoprostol (CYTOTEC) 200 MCG tablet Take 1 tablet (200 mcg total) by mouth 1 day or 1 dose. Place all 4 tabs deep in vagina at once  4 tablet  0   Results for orders placed during the hospital encounter of 12/02/13 (from the past 48 hour(s))  CBC WITH DIFFERENTIAL     Status: Abnormal   Collection Time    12/02/13  6:46 PM      Result Value Ref Range   WBC 9.8  4.0 - 10.5 K/uL   RBC 2.36 (*) 3.87 - 5.11 MIL/uL   Hemoglobin 6.3 (*) 12.0 - 15.0 g/dL   Comment: REPEATED TO VERIFY   CRITICAL RESULT CALLED TO, READ BACK BY AND VERIFIED WITH:     ROBB,M AT 1910 ON 12/02/13 BY HAGGINSC   HCT 19.9 (*) 36.0 - 46.0 %   MCV 84.3  78.0 - 100.0 fL   MCH 26.7  26.0 - 34.0 pg   MCHC 31.7  30.0 - 36.0 g/dL   RDW 69.614.8  29.511.5 - 28.415.5 %   Platelets 378  150 - 400 K/uL   Neutrophils Relative % 65  43 - 77 %   Neutro Abs 6.4  1.7 - 7.7 K/uL   Lymphocytes Relative 26  12 - 46 %   Lymphs Abs 2.6  0.7 - 4.0 K/uL   Monocytes Relative 7  3 - 12 %   Monocytes Absolute 0.7  0.1 - 1.0 K/uL   Eosinophils Relative 1  0 - 5 %   Eosinophils Absolute 0.1  0.0 - 0.7 K/uL   Basophils Relative 0  0 - 1 %   Basophils Absolute 0.0  0.0 - 0.1 K/uL  HCG, QUANTITATIVE, PREGNANCY     Status: Abnormal   Collection Time    12/02/13  6:46 PM  Result Value Ref Range   hCG, Beta Chain, Quant, S 166 (*) <5 mIU/mL   Comment:              GEST. AGE      CONC.  (mIU/mL)       <=1 WEEK        5 - 50         2 WEEKS       50 - 500         3 WEEKS       100 - 10,000         4 WEEKS     1,000 - 30,000         5 WEEKS     3,500 - 115,000       6-8 WEEKS     12,000 - 270,000        12 WEEKS     15,000 - 220,000                FEMALE AND NON-PREGNANT FEMALE:         LESS THAN 5 mIU/mL  TYPE AND SCREEN     Status: None   Collection Time    12/02/13  7:21 PM      Result Value Ref Range   ABO/RH(D) A POS     Antibody Screen NEG     Sample Expiration 12/05/2013     Review of Systems  Constitutional: Negative for fever and chills.  Gastrointestinal: Positive for abdominal pain (occasional cramping ). Negative for nausea and vomiting.  Neurological: Negative for weakness and headaches.   Physical Exam   Blood pressure 121/70, pulse 99, temperature 98.8 F (37.1 C), temperature source Oral, resp. rate 16, height 5' 7.5" (1.715 m), weight 85.639 kg (188 lb 12.8 oz), SpO2 100.00%, unknown if currently breastfeeding.  Physical Exam  Constitutional: She is oriented to person, place, and time. She appears  well-developed and well-nourished. No distress.  HENT:  Head: Normocephalic.  Eyes: Pupils are equal, round, and reactive to light.  Neck: Normal range of motion.  Respiratory: Effort normal.  GI: Soft. She exhibits no distension. There is no tenderness.  Genitourinary:  Scant amount of bright red blood on patients pad  Musculoskeletal: Normal range of motion.  Neurological: She is alert and oriented to person, place, and time.  Skin: She is not diaphoretic. There is pallor.  Psychiatric: Her behavior is normal.    MAU Course  Procedures None  MDM hbg 6.3 LR bolus Type and screen CBC  Orthostatic vitals  Discussed with Dr. Shawnie PonsPratt the patients critical hgb. Will call Dr. Shawnie PonsPratt with US results and NPO status.  Pt last ate at 1400  Assessment and Plan  Report given to Joseph BerkshireJulie Ethier [facility] and Dr. Shawnie PonsPratt who resumes care of the patient   Amanda HansenJennifer Irene Rubio, Amanda Rubio  12/02/2013, 8:37 PM   MDM Dr. Shawnie PonsPratt to MAU to discuss surgery with the patient. Cancel US at this time. Patient to OR with Dr. Shawnie PonsPratt.   Freddi StarrJulie N Ethier, PA-C 12/02/2013 9:11 PM

## 2013-12-02 NOTE — MAU Note (Signed)
Critical lab value rec'd  Hgb 6.3, Hct 19.9.  Venia CarbonJennifer Rasch NP notified.

## 2013-12-02 NOTE — MAU Provider Note (Signed)
Attestation of Attending Supervision of Advanced Practitioner (PA/CNM/NP): Evaluation and management procedures were performed by the Advanced Practitioner under my supervision and collaboration.  I have reviewed the Advanced Practitioner's note and chart, and I agree with the management and plan.  Kemo Spruce S Gwenetta Devos, MD [facility] Attending 12/02/2013 9:15 PM   

## 2013-12-02 NOTE — Op Note (Signed)
Amanda Rubio  PROCEDURE DATE: 12/02/2013  PREOPERATIVE DIAGNOSIS: retained POC  POSTOPERATIVE DIAGNOSIS: The same.  PROCEDURE:    Suction Dilation and Evacuation.  SURGEON:  Reva Boresanya S Anselm Aumiller  ANESTHESIA: Corky Soxhris Moser, MD  INDICATIONS: 23 y.o. G2P1011with TAB 3 wks ago,  Came in with bleeding and Hgb 9 10 days ago.  U/S showed POC. Returned after Cytotec with no improvement in bleeding, heavy bleeding today with Hgb of 6.2. Risks of surgery were discussed with the patient including but not limited to: bleeding which may require transfusion; infection which may require antibiotics; injury to uterus or surrounding organs;need for additional procedures including laparotomy or laparoscopy; possibility of intrauterine scarring which may impair future fertility; and other postoperative/anesthesia complications. Written informed consent was obtained.    FINDINGS:  A 10 wk size anteverted/midline/retroverted uterus, moderate amounts of products of conception, specimen sent to pathology.  ANESTHESIA:    Monitored intravenous sedation, paracervical block.  ESTIMATED BLOOD LOSS:  Less than 20 ml.  SPECIMENS:  Products of conception sent to pathology  COMPLICATIONS:  None immediate.  PROCEDURE DETAILS:  The patient received intravenous antibiotics while in the preoperative area.  She was then taken to the operating room where general anesthesia was administered and was found to be adequate.  After an adequate timeout was performed, she was placed in the dorsal lithotomy position and examined; then prepped and draped in the sterile manner.   Her bladder was catheterized for an unmeasured amount of clear, yellow urine. A vaginal speculum was then placed in the patient's vagina and a single tooth tenaculum was applied to the anterior lip of the cervix.  A paracervical block using 1% Lidocaine with Epinephrine was administered. The cervix was gently dilated to accommodate a 96F mm suction curette that was  gently advanced to the uterine fundus.  The suction device was then activated and curette slowly rotated to clear the uterus of products of conception.  A sharp curettage was then performed to confirm complete emptying of the uterus.There was minimal bleeding noted and the tenaculum removed with good hemostasis noted. The patient tolerated the procedure well.  The patient was taken to the recovery area in stable condition.  Reva Boresanya S Ido Wollman 12/02/2013 10:28 PM

## 2013-12-02 NOTE — Progress Notes (Signed)
CRITICAL VALUE ALERT  Critical value received:  hgb 6.3 and hct 19.9  Date of notification:  12/02/2013  Time of notification:  1908  Critical value read back:yes  Nurse who received alert:  Estanislado EmmsMelanie Vielka Klinedinst RN  MD notified (1st page):  Venia CarbonJennifer Rasch NP  Time of first page:  1908  MD notified (2nd page):  Time of second page:  Responding MD:  Blanche EastJ. Rasch NP  Time MD responded:  [phone]

## 2013-12-02 NOTE — Transfer of Care (Signed)
Immediate Anesthesia Transfer of Care Note  Patient: Amanda HugerDyesha D Gilder  Procedure(s) Performed: Procedure(s): DILATATION AND EVACUATION (N/A)  Patient Location: PACU  Anesthesia Type:MAC  Level of Consciousness: awake, alert  and oriented  Airway & Oxygen Therapy: Patient Spontanous Breathing  Post-op Assessment: Report given to PACU RN and Post -op Vital signs reviewed and stable  Post vital signs: Reviewed and stable  Complications: No apparent anesthesia complications

## 2013-12-03 NOTE — Anesthesia Postprocedure Evaluation (Signed)
  Anesthesia Post-op Note  Patient: Amanda Rubio  Procedure(s) Performed: Procedure(s): DILATATION AND EVACUATION (N/A)  Patient Location: PACU  Anesthesia Type:MAC  Level of Consciousness: awake and alert   Airway and Oxygen Therapy: Patient Spontanous Breathing  Post-op Pain: none  Post-op Assessment: Post-op Vital signs reviewed  Post-op Vital Signs: Reviewed and stable  Last Vitals:  Filed Vitals:   12/02/13 2320  BP: 110/72  Pulse: 93  Temp: 36.9 C  Resp: 23    Complications: No apparent anesthesia complications

## 2013-12-04 ENCOUNTER — Encounter (HOSPITAL_COMMUNITY): Payer: Self-pay | Admitting: Family Medicine

## 2013-12-05 LAB — TYPE AND SCREEN
ABO/RH(D): A POS
Antibody Screen: NEGATIVE
Unit division: 0
Unit division: 0

## 2013-12-18 ENCOUNTER — Telehealth: Payer: Self-pay | Admitting: General Practice

## 2013-12-18 ENCOUNTER — Ambulatory Visit: Payer: BC Managed Care – PPO | Admitting: Obstetrics & Gynecology

## 2013-12-18 NOTE — Telephone Encounter (Signed)
Patient no showed for appt today. Called patient and she stated she is still at work and forgot about the appt and would like it rescheduled. Told patient I would inform our front office ladies and they will contact her with a new appt. Patient verbalized understanding and had no further questions

## 2014-05-26 ENCOUNTER — Encounter (HOSPITAL_COMMUNITY): Payer: Self-pay | Admitting: Family Medicine

## 2015-07-28 ENCOUNTER — Emergency Department (HOSPITAL_BASED_OUTPATIENT_CLINIC_OR_DEPARTMENT_OTHER)
Admission: EM | Admit: 2015-07-28 | Discharge: 2015-07-28 | Disposition: A | Discharge status: Home / Self Care | Payer: Medicaid Other | Attending: Emergency Medicine | Admitting: Emergency Medicine

## 2015-07-28 ENCOUNTER — Encounter (HOSPITAL_BASED_OUTPATIENT_CLINIC_OR_DEPARTMENT_OTHER): Payer: Self-pay | Admitting: *Deleted

## 2015-07-28 DIAGNOSIS — Z3A01 Less than 8 weeks gestation of pregnancy: Secondary | ICD-10-CM | POA: Insufficient documentation

## 2015-07-28 DIAGNOSIS — O2341 Unspecified infection of urinary tract in pregnancy, first trimester: Secondary | ICD-10-CM | POA: Insufficient documentation

## 2015-07-28 DIAGNOSIS — R112 Nausea with vomiting, unspecified: Secondary | ICD-10-CM

## 2015-07-28 DIAGNOSIS — N390 Urinary tract infection, site not specified: Secondary | ICD-10-CM

## 2015-07-28 DIAGNOSIS — O210 Mild hyperemesis gravidarum: Secondary | ICD-10-CM | POA: Insufficient documentation

## 2015-07-28 DIAGNOSIS — Z79899 Other long term (current) drug therapy: Secondary | ICD-10-CM | POA: Insufficient documentation

## 2015-07-28 LAB — CBC WITH DIFFERENTIAL/PLATELET
BASOS ABS: 0 10*3/uL (ref 0.0–0.1)
Basophils Relative: 0 %
EOS ABS: 0.1 10*3/uL (ref 0.0–0.7)
EOS PCT: 1 %
HCT: 35.7 % — ABNORMAL LOW (ref 36.0–46.0)
Hemoglobin: 11.6 g/dL — ABNORMAL LOW (ref 12.0–15.0)
Lymphocytes Relative: 18 %
Lymphs Abs: 1.3 10*3/uL (ref 0.7–4.0)
MCH: 27.6 pg (ref 26.0–34.0)
MCHC: 32.5 g/dL (ref 30.0–36.0)
MCV: 85 fL (ref 78.0–100.0)
Monocytes Absolute: 0.5 10*3/uL (ref 0.1–1.0)
Monocytes Relative: 7 %
Neutro Abs: 5.1 10*3/uL (ref 1.7–7.7)
Neutrophils Relative %: 74 %
PLATELETS: 249 10*3/uL (ref 150–400)
RBC: 4.2 MIL/uL (ref 3.87–5.11)
RDW: 13.7 % (ref 11.5–15.5)
WBC: 6.9 10*3/uL (ref 4.0–10.5)

## 2015-07-28 LAB — URINALYSIS, ROUTINE W REFLEX MICROSCOPIC
Bilirubin Urine: NEGATIVE
Glucose, UA: NEGATIVE mg/dL
Hgb urine dipstick: NEGATIVE
Ketones, ur: 15 mg/dL — AB
NITRITE: NEGATIVE
PH: 6 (ref 5.0–8.0)
Protein, ur: 30 mg/dL — AB
SPECIFIC GRAVITY, URINE: 1.033 — AB (ref 1.005–1.030)

## 2015-07-28 LAB — COMPREHENSIVE METABOLIC PANEL
ALT: 12 U/L — AB (ref 14–54)
AST: 14 U/L — AB (ref 15–41)
Albumin: 3.9 g/dL (ref 3.5–5.0)
Alkaline Phosphatase: 44 U/L (ref 38–126)
Anion gap: 5 (ref 5–15)
BUN: 10 mg/dL (ref 6–20)
CO2: 22 mmol/L (ref 22–32)
CREATININE: 0.84 mg/dL (ref 0.44–1.00)
Calcium: 8.7 mg/dL — ABNORMAL LOW (ref 8.9–10.3)
Chloride: 108 mmol/L (ref 101–111)
GFR calc non Af Amer: >60 mL/min (ref >60)
Glucose, Bld: 96 mg/dL (ref 65–99)
Potassium: 3.6 mmol/L (ref 3.5–5.1)
SODIUM: 135 mmol/L (ref 135–145)
Total Bilirubin: 0.4 mg/dL (ref 0.3–1.2)
Total Protein: 7.3 g/dL (ref 6.5–8.1)

## 2015-07-28 LAB — URINE MICROSCOPIC-ADD ON

## 2015-07-28 LAB — PREGNANCY, URINE: PREG TEST UR: POSITIVE — AB

## 2015-07-28 MED ORDER — CEFTRIAXONE SODIUM 1 G IJ SOLR
1.0000 g | Freq: Once | INTRAMUSCULAR | Status: AC
  Administered 2015-07-28: 1 g via INTRAVENOUS

## 2015-07-28 MED ORDER — DOXYLAMINE SUCCINATE (SLEEP) 25 MG PO TABS
25.0000 mg | ORAL_TABLET | Freq: Once | ORAL | Status: DC
  Filled 2015-07-28: qty 1

## 2015-07-28 MED ORDER — PYRIDOXINE HCL 100 MG/ML IJ SOLN
100.0000 mg | Freq: Once | INTRAMUSCULAR | Status: AC
  Administered 2015-07-28: 100 mg via INTRAVENOUS
  Filled 2015-07-28: qty 1

## 2015-07-28 MED ORDER — CEFTRIAXONE SODIUM 1 G IJ SOLR
INTRAMUSCULAR | Status: AC
  Filled 2015-07-28: qty 10

## 2015-07-28 MED ORDER — DOXYLAMINE-PYRIDOXINE 10-10 MG PO TBEC: 2.0000 | DELAYED_RELEASE_TABLET | Freq: Every day | ORAL | Status: DC

## 2015-07-28 MED ORDER — SODIUM CHLORIDE 0.9 % IV BOLUS (SEPSIS)
1000.0000 mL | Freq: Once | INTRAVENOUS | Status: AC
  Administered 2015-07-28: 1000 mL via INTRAVENOUS

## 2015-07-28 MED ORDER — CEPHALEXIN 500 MG PO CAPS
500.0000 mg | ORAL_CAPSULE | Freq: Four times a day (QID) | ORAL | Status: AC
Start: 2015-07-28 — End: 2015-08-04

## 2015-07-28 NOTE — ED Provider Notes (Signed)
CSN: [id number]     Arrival date & time 07/28/15  0912 History   First MD Initiated Contact with Patient 07/28/15 1108     Chief Complaint  Patient presents with  . Emesis     (Consider location/radiation/quality/duration/timing/severity/associated sxs/prior Treatment) HPI Comments: 7wk pregant by LMP 25yo son, no other pregnancies   Patient is a 25 y.o. female presenting with vomiting.  Emesis Severity:  Severe Duration:  1 week Timing:  Constant Number of daily episodes:  6 episodes Progression:  Worsening Chronicity:  New Recent urination:  Normal Relieved by:  Nothing Worsened by:  Nothing tried Associated symptoms: no abdominal pain, no cough, no diarrhea, no fever, no headaches, no myalgias, no sore throat and no URI   Risk factors: pregnant now     Past Medical History  Diagnosis Date  . Medical history non-contributory    Past Surgical History  Procedure Laterality Date  . Bunionectomy    . Wisdom tooth extraction    . Breast surgery    . Dilation and evacuation N/A 12/02/2013    Procedure: DILATATION AND EVACUATION;  Surgeon: Reva Bores, MD;  Location: [facility];  Service: Gynecology;  Laterality: N/A;   History reviewed. No pertinent family history. Social History  Substance Use Topics  . Smoking status: Never Smoker   . Smokeless tobacco: None  . Alcohol Use: No     Comment: very rare   OB History    Gravida Para Term Preterm AB TAB SAB Ectopic Multiple Living   3 1 1  1   0   1     Review of Systems  Constitutional: Negative for fever.  HENT: Negative for sore throat.   Eyes: Negative for visual disturbance.  Respiratory: Negative for cough and shortness of breath.   Cardiovascular: Negative for chest pain.  Gastrointestinal: Positive for nausea and vomiting. Negative for abdominal pain and diarrhea.  Genitourinary: Negative for dysuria, vaginal bleeding, vaginal discharge and difficulty urinating.  Musculoskeletal: Negative for myalgias, back  pain and neck pain.  Skin: Negative for rash.  Neurological: Negative for syncope and headaches.      Allergies  Review of patient's allergies indicates no known allergies.  Home Medications   Prior to Admission medications   Medication Sig Start Date End Date Taking? Authorizing Provider  cephALEXin (KEFLEX) 500 MG capsule Take 1 capsule (500 mg total) by mouth 4 (four) times daily. 07/28/15 08/04/15  Alvira Monday, MD  Doxylamine-Pyridoxine (DICLEGIS) 10-10 MG TBEC Take 2 tablets by mouth at bedtime. If 2 tablets at bedtime is not enough to control nausea, may increase dose to one tablet in the morning, one tablet  In the afternoon and 2 tablets at bedtime 07/28/15   Alvira Monday, MD  Fe Fum-FA-B Cmp-C-Zn-Mg-Mn-Cu (PUREFE PLUS) 106-1 MG CAPS Take 1 capsule by mouth 2 (two) times daily. 12/02/13   Reva Bores, MD   BP 113/68 mmHg  Pulse 72  Temp(Src) 98.1 F (36.7 C) (Oral)  Resp 16  Ht 5\' 8"  (1.727 m)  Wt 209 lb (94.802 kg)  BMI 31.79 kg/m2  SpO2 100% Physical Exam  Constitutional: She is oriented to person, place, and time. She appears well-developed and well-nourished. No distress.  HENT:  Head: Normocephalic and atraumatic.  Eyes: Conjunctivae and EOM are normal.  Neck: Normal range of motion.  Cardiovascular: Normal rate, regular rhythm, normal heart sounds and intact distal pulses.  Exam reveals no gallop and no friction rub.   No murmur heard. Pulmonary/Chest: Effort  normal and breath sounds normal. No respiratory distress. She has no wheezes. She has no rales.  Abdominal: Soft. She exhibits no distension. There is no tenderness. There is no guarding.  Musculoskeletal: She exhibits no edema or tenderness.  Neurological: She is alert and oriented to person, place, and time.  Skin: Skin is warm and dry. No rash noted. She is not diaphoretic. No erythema.  Nursing note and vitals reviewed.   ED Course  Procedures (including critical care time) Labs Review Labs  Reviewed  URINALYSIS, ROUTINE W REFLEX MICROSCOPIC (NOT [facility]) - Abnormal; Notable for the following:    Color, Urine AMBER (*)    APPearance CLOUDY (*)    Specific Gravity, Urine 1.033 (*)    Ketones, ur 15 (*)    Protein, ur 30 (*)    Leukocytes, UA MODERATE (*)    All other components within normal limits  URINE MICROSCOPIC-ADD ON - Abnormal; Notable for the following:    Squamous Epithelial / LPF 6-30 (*)    Bacteria, UA MANY (*)    All other components within normal limits  PREGNANCY, URINE - Abnormal; Notable for the following:    Preg Test, Ur POSITIVE (*)    All other components within normal limits  CBC WITH DIFFERENTIAL/PLATELET - Abnormal; Notable for the following:    Hemoglobin 11.6 (*)    HCT 35.7 (*)    All other components within normal limits  COMPREHENSIVE METABOLIC PANEL - Abnormal; Notable for the following:    Calcium 8.7 (*)    AST 14 (*)    ALT 12 (*)    All other components within normal limits    Imaging Review No results found. I have personally reviewed and evaluated these images and lab results as part of my medical decision-making.   EKG Interpretation None      MDM   Final diagnoses:  Non-intractable vomiting with nausea, vomiting of unspecified type  UTI (lower urinary tract infection)   25 year old female GU to P1 presents at 7 weeks by last menstrual period with concern of intractable nausea and vomiting. No abdominal pain, no bleeding. Patient reports she cannot hold him any anything down. Labs obtained showing no significant signs of dehydration. Patient was given 1 L of normal saline and pyridoxine with improvement of her symptoms. Urinalysis concerning for UTI. Patient does not have any flank pain, no fevers, no leukocytosis, do not feel she requires an admission for UTI.  Patient may have asymptomatic bacteuria of pregnancy with morning sickness/hyperemesis and doubt pyelonephritis.  Given rocephin 1g while in ED.  patient able to  tolerate fluids in the emergency department without emesis. She is given a prescription for Diclegis for nausea and Keflex for UTI. Discussed reasons to return in detail. Patient discharged in stable condition with understanding of reasons to return.   Alvira MondayErin Loyd Marhefka, MD 07/28/15 1944

## 2015-07-28 NOTE — ED Notes (Addendum)
Pt amb to triage with quick steady gait smiling in nad. Pt reports emesis x 1 week, with "dark" and "cloudy" urine, pt states she is [redacted] weeks pregnant, and wonders if she has a uti.

## 2015-07-28 NOTE — ED Notes (Signed)
MD at bedside. 

## 2016-02-09 ENCOUNTER — Emergency Department (HOSPITAL_BASED_OUTPATIENT_CLINIC_OR_DEPARTMENT_OTHER): Payer: Medicaid Other

## 2016-02-09 ENCOUNTER — Encounter (HOSPITAL_BASED_OUTPATIENT_CLINIC_OR_DEPARTMENT_OTHER): Payer: Self-pay | Admitting: *Deleted

## 2016-02-09 ENCOUNTER — Emergency Department (HOSPITAL_BASED_OUTPATIENT_CLINIC_OR_DEPARTMENT_OTHER)
Admission: EM | Admit: 2016-02-09 | Discharge: 2016-02-09 | Disposition: A | Discharge status: Home / Self Care | Payer: Medicaid Other | Attending: Emergency Medicine | Admitting: Emergency Medicine

## 2016-02-09 DIAGNOSIS — M25572 Pain in left ankle and joints of left foot: Secondary | ICD-10-CM

## 2016-02-09 DIAGNOSIS — X501XXA Overexertion from prolonged static or awkward postures, initial encounter: Secondary | ICD-10-CM | POA: Insufficient documentation

## 2016-02-09 DIAGNOSIS — M25472 Effusion, left ankle: Secondary | ICD-10-CM | POA: Insufficient documentation

## 2016-02-09 DIAGNOSIS — Y9389 Activity, other specified: Secondary | ICD-10-CM | POA: Insufficient documentation

## 2016-02-09 DIAGNOSIS — Y990 Civilian activity done for income or pay: Secondary | ICD-10-CM | POA: Insufficient documentation

## 2016-02-09 DIAGNOSIS — M79672 Pain in left foot: Secondary | ICD-10-CM | POA: Insufficient documentation

## 2016-02-09 DIAGNOSIS — Y92199 Unspecified place in other specified residential institution as the place of occurrence of the external cause: Secondary | ICD-10-CM | POA: Insufficient documentation

## 2016-02-09 MED ORDER — NAPROXEN 500 MG PO TABS: 500.0000 mg | ORAL_TABLET | Freq: Two times a day (BID) | ORAL | Status: DC

## 2016-02-09 NOTE — ED Notes (Signed)
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 Swelling noted.

## 2016-02-09 NOTE — ED Provider Notes (Signed)
CSN: [id number]     Arrival date & time 02/09/16  1038 History   First MD Initiated Contact with Patient 02/09/16 1109     Chief Complaint  Patient presents with  . 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 Patient states that she heard a pop after the incident. Patient has continued to walk on her foot with shooting pain from below the lateral malleolus to her medial dorsal foot. Patient denies any numbness or tingling. Patient has been trying ice and heat compresses, as well as ibuprofen without much relief. Patient reports that she has had swelling to her lateral foot that seems to be worsening, or at least not improving. Patient has a history of bunionectomy to her left foot in childhood. Patient denies any chest pain, shortness of breath, abdominal pain, nausea, vomiting, dysuria.  Patient is a 25 y.o. female presenting with lower extremity injury. The history is provided by the patient.  Ankle Injury Associated symptoms include arthralgias and joint swelling (L foot/ankle). Pertinent negatives include no abdominal pain, chest pain, chills, fever, headaches, nausea, rash, sore throat or vomiting.    Past Medical History  Diagnosis Date  . Medical history non-contributory    Past Surgical History  Procedure Laterality Date  . Bunionectomy    . Wisdom tooth extraction    . Breast surgery    . Dilation and evacuation N/A 12/02/2013    Procedure: DILATATION AND EVACUATION;  Surgeon: Amanda Bores, MD;  Location: [facility];  Service: Gynecology;  Laterality: N/A;   No family history on file. Social History  Substance Use Topics  . Smoking status: Never Smoker   . Smokeless tobacco: None  . Alcohol Use: No     Comment: very rare   OB History    Gravida Para Term Preterm AB TAB SAB Ectopic Multiple Living   3 1 1  1   0   1     Review of Systems  Constitutional: Negative for fever and chills.  HENT: Negative for facial swelling and sore throat.   Respiratory: Negative for shortness of breath.   Cardiovascular: Negative for chest pain.  Gastrointestinal: Negative for nausea, vomiting and abdominal pain.  Genitourinary: Negative for dysuria.  Musculoskeletal: Positive for joint swelling (L foot/ankle) and arthralgias. Negative for back pain.  Skin: Negative for rash and wound.  Neurological: Negative for headaches.  Psychiatric/Behavioral: The patient is not nervous/anxious.       Allergies  Review of patient's allergies indicates no known allergies.  Home Medications   Prior to Admission medications   Medication Sig Start Date End Date Taking? Authorizing Provider  Doxylamine-Pyridoxine (DICLEGIS) 10-10 MG TBEC Take 2 tablets by mouth at bedtime. If 2 tablets at bedtime is not enough to control nausea, may increase dose to one tablet in the morning, one tablet  In the afternoon and 2 tablets at bedtime 07/28/15   Amanda Monday, MD  Fe Fum-FA-B Cmp-C-Zn-Mg-Mn-Cu (PUREFE PLUS) 106-1 MG CAPS Take 1 capsule by mouth 2 (two) times daily. 12/02/13   Amanda Bores, MD  naproxen (NAPROSYN) 500 MG tablet Take 1 tablet (500 mg total) by mouth 2 (two) times daily. 02/09/16   Amanda Rubio M Amanda Bieda, PA-C   BP 135/92 mmHg  Pulse 69  Temp(Src) 98.5  F (36.9 C) (Oral)  Resp 18  Ht  (1.727 m)  Wt 89.812 kg  BMI 30.11 kg/m2  SpO2 100%  LMP 02/03/2016  Breastfeeding? Unknown Physical Exam  Constitutional: She appears well-developed and well-nourished. No distress.  HENT:  Head: Normocephalic and atraumatic.  Mouth/Throat: Oropharynx is clear and moist. No oropharyngeal exudate.  Eyes: Conjunctivae are normal. Pupils are equal, round, and reactive to light. Right eye exhibits no discharge. Left  eye exhibits no discharge. No scleral icterus.  Neck: Normal range of motion. Neck supple. No thyromegaly present.  Cardiovascular: Normal rate, regular rhythm, normal heart sounds and intact distal pulses.  Exam reveals no gallop and no friction rub.   No murmur heard. Pulmonary/Chest: Effort normal and breath sounds normal. No stridor. No respiratory distress. She has no wheezes. She has no rales.  Abdominal: Soft. Bowel sounds are normal. She exhibits no distension. There is no tenderness. There is no rebound and no guarding.  Musculoskeletal: She exhibits no edema.       Left ankle: She exhibits swelling (mild). She exhibits normal range of motion, no laceration and normal pulse. Tenderness. AITFL, CF ligament and head of 5th metatarsal tenderness found. No lateral malleolus and no medial malleolus tenderness found. Achilles tendon normal.       Feet:  5/5 strength with plantar and dorsiflexion; normal sensation, cap refill <2sec; patient able to move all toes freely without difficulty  Lymphadenopathy:    She has no cervical adenopathy.  Neurological: She is alert. Coordination normal.  Skin: Skin is warm and dry. No rash noted. She is not diaphoretic. No pallor.  Psychiatric: She has a normal mood and affect.  Nursing note and vitals reviewed.   ED Course  Procedures (including critical care time) Labs Review Labs Reviewed - No data to display  Imaging Review Dg Ankle Complete Left  02/09/2016  CLINICAL DATA:  Twisted left ankle 1 week ago, pain and swelling EXAM: LEFT ANKLE COMPLETE - 3+ VIEW COMPARISON:  None FINDINGS: Three views of the left ankle submitted. No acute fracture or subluxation. Ankle mortise is preserved. Postsurgical changes are noted first metatarsal. IMPRESSION: Negative. Electronically Signed   By: Amanda Rubio M.D.   On: 02/09/2016 11:42   Dg Foot Complete Left  02/09/2016  CLINICAL DATA:  Rolled ankle 1 week ago, medial and lateral pain EXAM: LEFT FOOT -  COMPLETE 3+ VIEW COMPARISON:  Left ankle same day FINDINGS: Three views of the left foot submitted. No acute fracture or subluxation. Postsurgical changes are noted first metatarsal. No periosteal reaction or bony erosion. IMPRESSION: Negative. Electronically Signed   By: Amanda Rubio M.D.   On: 02/09/2016 12:16   I have personally reviewed and evaluated these images and lab results as part of my medical decision-making.   EKG Interpretation None      MDM   Patient X-Ray of ankle and foot negative for obvious fracture or dislocation. Home Care: Rest and elevate the injured ankle, apply ice intermittently. Use crutches without weight bearing until able to comfortable bear partial weight, then progress to full weight bearing as tolerated. ASO splint applied. See Sports medicine as needed if symptoms are continuing.  Patient discharged with naproxen. Patient will be dc home & is agreeable with above plan. I discussed patient with Dr. Cyndie Chime who is in agreement with plan. Patient vitals stable throughout ED course and discharged in satisfactory condition.   Final diagnoses:  Left ankle pain  Left foot pain  Emi Holeslexandra M Bethel Sirois, PA-C 02/09/16 1239   Leta BaptistEmily Roe Nguyen, MD 02/15/16 [phone]

## 2016-02-09 NOTE — Discharge Instructions (Signed)
Medications: Naprosyn  Treatment: Take Naprosyn twice daily as needed for your pain. Use your crutches as often as possible to rest your ankle. Use ice 3-4 times daily alternating 20 minutes on, 20 minutes off. Wear your ankle splint at all times, except when bathing. Elevate your foot when you are not walking or standing on it.  Follow-up: Please follow-up with Dr. Pearletha Forge if you continue to have difficulty with your ankle. Please return to the emergency department if you develop any new or worsening symptoms.   Ankle Pain Ankle pain is a common symptom. The bones, cartilage, tendons, and muscles of the ankle joint perform a lot of work each day. The ankle joint holds your body weight and allows you to move around. Ankle pain can occur on either side or back of 1 or both ankles. Ankle pain may be sharp and burning or dull and aching. There may be tenderness, stiffness, redness, or warmth around the ankle. The pain occurs more often when a person walks or puts pressure on the ankle. CAUSES  There are many reasons ankle pain can develop. It is important to work with your caregiver to identify the cause since many conditions can impact the bones, cartilage, muscles, and tendons. Causes for ankle pain include:  Injury, including a break (fracture), sprain, or strain often due to a fall, sports, or a high-impact activity.  Swelling (inflammation) of a tendon (tendonitis).  Achilles tendon rupture.  Ankle instability after repeated sprains and strains.  Poor foot alignment.  Pressure on a nerve (tarsal tunnel syndrome).  Arthritis in the ankle or the lining of the ankle.  Crystal formation in the ankle (gout or pseudogout). DIAGNOSIS  A diagnosis is based on your medical history, your symptoms, results of your physical exam, and results of diagnostic tests. Diagnostic tests may include X-ray exams or a computerized magnetic scan (magnetic resonance imaging, MRI). TREATMENT  Treatment will  depend on the cause of your ankle pain and may include:  Keeping pressure off the ankle and limiting activities.  Using crutches or other walking support (a cane or brace).  Using rest, ice, compression, and elevation.  Participating in physical therapy or home exercises.  Wearing shoe inserts or special shoes.  Losing weight.  Taking medications to reduce pain or swelling or receiving an injection.  Undergoing surgery. HOME CARE INSTRUCTIONS   Only take over-the-counter or prescription medicines for pain, discomfort, or fever as directed by your caregiver.  Put ice on the injured area.  Put ice in a plastic bag.  Place a towel between your skin and the bag.  Leave the ice on for 15-20 minutes at a time, 03-04 times a day.  Keep your leg raised (elevated) when possible to lessen swelling.  Avoid activities that cause ankle pain.  Follow specific exercises as directed by your caregiver.  Record how often you have ankle pain, the location of the pain, and what it feels like. This information may be helpful to you and your caregiver.  Ask your caregiver about returning to work or sports and whether you should drive.  Follow up with your caregiver for further examination, therapy, or testing as directed. SEEK MEDICAL CARE IF:   Pain or swelling continues or worsens beyond 1 week.  You have an oral temperature above 102 F (38.9 C).  You are feeling unwell or have chills.  You are having an increasingly difficult time with walking.  You have loss of sensation or other new symptoms.  You  have questions or concerns. MAKE SURE YOU:   Understand these instructions.  Will watch your condition.  Will get help right away if you are not doing well or get worse.   This information is not intended to replace advice given to you by your health care provider. Make sure you discuss any questions you have with your health care provider.   Document Released: 12/29/2009  Document Revised: 10/03/2011 Document Reviewed: 02/10/2015 Elsevier Interactive Patient Education [employer]2016 [employer] Inc.

## 2016-02-09 NOTE — ED Notes (Signed)
ICE pack given. 

## 2016-02-16 ENCOUNTER — Encounter: Payer: Self-pay | Admitting: Family Medicine

## 2016-02-16 ENCOUNTER — Ambulatory Visit (INDEPENDENT_AMBULATORY_CARE_PROVIDER_SITE_OTHER): Payer: Self-pay | Admitting: Family Medicine

## 2016-02-16 DIAGNOSIS — S99912A Unspecified injury of left ankle, initial encounter: Secondary | ICD-10-CM

## 2016-02-16 NOTE — Patient Instructions (Addendum)
You have an ankle sprain. Icing, aleve only if needed at this point. Use laceup ankle brace to help with stability while you recover from this injury for 3-4 more weeks with long walking, at work, and when on irregular surfaces. Start theraband strengthening exercises when directed - once a day 3 sets of 10 for 4 more weeks. Consider physical therapy for strengthening and balance exercises. If not improving as expected, we may repeat x-rays or consider further testing like an MRI. Follow up with me in 4 weeks or as needed.

## 2016-02-17 DIAGNOSIS — S99912A Unspecified injury of left ankle, initial encounter: Secondary | ICD-10-CM | POA: Insufficient documentation

## 2016-02-17 NOTE — Assessment & Plan Note (Signed)
independently reviewed radiographs and no fracture.  Still with some laxity of ATFL on exam but no pain now.  Shown home exercises to do daily.  Wear ASO with long walking, at work, and irregular surfaces for 4 more weeks.  F/u in 4 weeks or prn.  Ok to return to work full duty.

## 2016-02-17 NOTE — Progress Notes (Signed)
PCP: Pcp Not In System  Subjective:   HPI: Patient is a 25 y.o. female here for left ankle injury.  Patient reports on 7/18 she started to get pain in left ankle. 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 Didn't start hurting until later though. Currently has no pain at rest. Anxious to get back to work. Pain was sharp, lateral and anterior. Not taking any medicines or icing now. Is using ASO for support. No skin changes, numbness.  Past Medical History:  Diagnosis Date  . Medical history non-contributory     Current Outpatient Prescriptions on File Prior to Visit  Medication Sig Dispense Refill  . Doxylamine-Pyridoxine (DICLEGIS) 10-10 MG TBEC Take 2 tablets by mouth at bedtime. If 2 tablets at bedtime is not enough to control nausea, may increase dose to one tablet in the morning, one tablet  In the afternoon and 2 tablets at bedtime 60 tablet 0  . Fe Fum-FA-B Cmp-C-Zn-Mg-Mn-Cu (PUREFE PLUS) 106-1 MG CAPS Take 1 capsule by mouth 2 (two) times daily. 60 capsule 2  . naproxen (NAPROSYN) 500 MG tablet Take 1 tablet (500 mg total) by mouth 2 (two) times daily. 14 tablet 0   No current facility-administered medications on file prior to visit.     Past Surgical History:  Procedure Laterality Date  . BREAST SURGERY    . BUNIONECTOMY    . DILATION AND EVACUATION N/A 12/02/2013   Procedure: DILATATION AND EVACUATION;  Surgeon: Reva Bores, MD;  Location: [facility];  Service: Gynecology;  Laterality: N/A;  . WISDOM TOOTH EXTRACTION      No Known Allergies  Social History   Social History  . Marital status: Single    Spouse name: N/A  . Number of children: N/A  . Years of education: N/A   Occupational History  . Not on file.   Social History Main Topics  . Smoking status: Never Smoker  . Smokeless tobacco: Not on file  . Alcohol use No     Comment: very rare  . Drug use: No  . Sexual activity: Yes    Birth  control/ protection: None   Other Topics Concern  . Not on file   Social History Narrative  . No narrative on file    No family history on file.  BP (!) 137/91   Pulse 73   Ht 5\' 8"  (1.727 m)   Wt 196 lb (88.9 kg)   LMP 02/03/2016   BMI 29.80 kg/m   Review of Systems: See HPI above.    Objective:  Physical Exam:  Gen: NAD, comfortable in exam room  Left ankle/foot: No gross deformity, swelling, ecchymoses FROM No TTP 1+ ant drawer, negative and talar tilt.   Negative syndesmotic compression. Thompsons test negative. NV intact distally.  Right foot/ankle: FROM without pain.    Assessment & Plan:  1. Left ankle sprain - independently reviewed radiographs and no fracture.  Still with some laxity of ATFL on exam but no pain now.  Shown home exercises to do daily.  Wear ASO with long walking, at work, and irregular surfaces for 4 more weeks.  F/u in 4 weeks or prn.  Ok to return to work full duty.

## 2016-03-25 ENCOUNTER — Encounter (HOSPITAL_BASED_OUTPATIENT_CLINIC_OR_DEPARTMENT_OTHER): Payer: Self-pay | Admitting: *Deleted

## 2016-03-25 ENCOUNTER — Emergency Department (HOSPITAL_BASED_OUTPATIENT_CLINIC_OR_DEPARTMENT_OTHER)
Admission: EM | Admit: 2016-03-25 | Discharge: 2016-03-25 | Disposition: A | Discharge status: Home / Self Care | Payer: Medicaid Other | Attending: Emergency Medicine | Admitting: Emergency Medicine

## 2016-03-25 DIAGNOSIS — N939 Abnormal uterine and vaginal bleeding, unspecified: Secondary | ICD-10-CM

## 2016-03-25 DIAGNOSIS — M549 Dorsalgia, unspecified: Secondary | ICD-10-CM | POA: Insufficient documentation

## 2016-03-25 DIAGNOSIS — R109 Unspecified abdominal pain: Secondary | ICD-10-CM

## 2016-03-25 LAB — CBC WITH DIFFERENTIAL/PLATELET
BASOS ABS: 0 10*3/uL (ref 0.0–0.1)
BASOS PCT: 1 %
EOS ABS: 0.1 10*3/uL (ref 0.0–0.7)
EOS PCT: 3 %
HCT: 35.5 % — ABNORMAL LOW (ref 36.0–46.0)
Hemoglobin: 11.8 g/dL — ABNORMAL LOW (ref 12.0–15.0)
Lymphocytes Relative: 31 %
Lymphs Abs: 1.5 10*3/uL (ref 0.7–4.0)
MCH: 29.1 pg (ref 26.0–34.0)
MCHC: 33.2 g/dL (ref 30.0–36.0)
MCV: 87.7 fL (ref 78.0–100.0)
MONO ABS: 0.5 10*3/uL (ref 0.1–1.0)
Monocytes Relative: 9 %
Neutro Abs: 2.7 10*3/uL (ref 1.7–7.7)
Neutrophils Relative %: 56 %
PLATELETS: 257 10*3/uL (ref 150–400)
RBC: 4.05 MIL/uL (ref 3.87–5.11)
RDW: 14 % (ref 11.5–15.5)
WBC: 4.8 10*3/uL (ref 4.0–10.5)

## 2016-03-25 LAB — COMPREHENSIVE METABOLIC PANEL
ALBUMIN: 3.8 g/dL (ref 3.5–5.0)
ALT: 15 U/L (ref 14–54)
AST: 17 U/L (ref 15–41)
Alkaline Phosphatase: 56 U/L (ref 38–126)
Anion gap: 7 (ref 5–15)
BUN: 14 mg/dL (ref 6–20)
CHLORIDE: 108 mmol/L (ref 101–111)
CO2: 23 mmol/L (ref 22–32)
Calcium: 8.5 mg/dL — ABNORMAL LOW (ref 8.9–10.3)
Creatinine, Ser: 0.83 mg/dL (ref 0.44–1.00)
GFR calc Af Amer: >60 mL/min (ref >60)
GLUCOSE: 106 mg/dL — AB (ref 65–99)
POTASSIUM: 3.5 mmol/L (ref 3.5–5.1)
SODIUM: 138 mmol/L (ref 135–145)
Total Bilirubin: 0.2 mg/dL — ABNORMAL LOW (ref 0.3–1.2)
Total Protein: 6.7 g/dL (ref 6.5–8.1)

## 2016-03-25 LAB — URINALYSIS, ROUTINE W REFLEX MICROSCOPIC
Bilirubin Urine: NEGATIVE
GLUCOSE, UA: NEGATIVE mg/dL
KETONES UR: NEGATIVE mg/dL
LEUKOCYTES UA: NEGATIVE
Nitrite: NEGATIVE
PROTEIN: NEGATIVE mg/dL
Specific Gravity, Urine: 1.026 (ref 1.005–1.030)
pH: 6 (ref 5.0–8.0)

## 2016-03-25 LAB — WET PREP, GENITAL
Sperm: NONE SEEN
Trich, Wet Prep: NONE SEEN
Yeast Wet Prep HPF POC: NONE SEEN

## 2016-03-25 LAB — URINE MICROSCOPIC-ADD ON

## 2016-03-25 LAB — PREGNANCY, URINE: PREG TEST UR: NEGATIVE

## 2016-03-25 LAB — LIPASE, BLOOD: LIPASE: 37 U/L (ref 11–51)

## 2016-03-25 MED ORDER — IBUPROFEN 400 MG PO TABS
600.0000 mg | ORAL_TABLET | Freq: Once | ORAL | Status: AC
  Administered 2016-03-25: 600 mg via ORAL
  Filled 2016-03-25: qty 1

## 2016-03-25 MED ORDER — METRONIDAZOLE 500 MG PO TABS: 500.0000 mg | ORAL_TABLET | Freq: Two times a day (BID) | ORAL | 0 refills | Status: DC

## 2016-03-25 MED FILL — metroNIDAZOLE 500 MG TABS: 500 | 7 days supply | Qty: 14 | Fill #0

## 2016-03-25 NOTE — Discharge Instructions (Addendum)
Read the information below.   Your labs were negative for a UTI. Your hemoglobin is stable.  You do have bacterial vaginosis and are being prescribed an antibiotic. Take as directed. Discontinue and return to ED if you develop a rash or difficulty breathing.  You can take motrin 400mg  every 6hrs for pain relief.  Use the prescribed medication as directed.  Please discuss all new medications with your pharmacist.   It is important to follow up with your OBGYN next week. Please call to schedule an appointment.  You may return to the Emergency Department at any time for worsening condition or any new symptoms that concern you. Return to ED if you develop fever, constant abdominal pain, inability to keep food/fluids down, blood in stool, loss of consciousness, or need to change super pad every 15-30 minutes.

## 2016-03-25 NOTE — ED Notes (Signed)
PA at bedside.

## 2016-03-25 NOTE — ED Provider Notes (Signed)
[facility] Provider Note   CSN: [id number] Arrival date & time: 03/25/16  1043     History   Chief Complaint Chief Complaint  Patient presents with  . Vaginal Bleeding    irregular  . Back Pain    HPI Amanda Rubio is a 25 y.o. female.  Amanda Rubio is a 25 y.o. female [phone] with no pertinent medical history presents to ED with complaint of vaginal bleeding. Patient states she has had vaginal bleeding and spotting since 02/16/16. She states she is typically very regular with her menstrual cycles, last one being 02/03/16. She does not saturate through a panty liner. She denies fever, vaginal discharge, vaginal pain, pelvic pain, dysuria, hematuria, fatigue, lightheadedness, numbness, weakness, chest pain, shortness of breath, or syncope. She is sexually active with one female partner, husband, no protection used. Of note, patient also endorses intermittent left sided abdominal pain with radiation into back for the last 2-3 months. She states pain only comes on when she lies directly on her stomach and is relieved if she lays on her side. She denies any trauma to her abdomen. Denies, nausea, vomiting, diarrhea, constipation, or blood in stool. No loss of bowel or bladder function. No IVDU or h/o cancer. No saddle anesthesia. 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 She has not tried any treatments PTA.       Past Medical History:  Diagnosis Date  . Medical history non-contributory     Patient Active Problem List   Diagnosis Date Noted  . Left ankle injury 02/17/2016  . Acute blood loss anemia 12/02/2013  . 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 BREAST SURGERY    . BUNIONECTOMY    . DILATION AND EVACUATION N/A 12/02/2013   Procedure: DILATATION AND EVACUATION;  Surgeon: Reva Bores, MD;  Location: [facility];  Service:  Gynecology;  Laterality: N/A;  . WISDOM TOOTH EXTRACTION      OB History    Gravida Para Term Preterm AB Living   3 1 1   1 1    SAB TAB Ectopic Multiple Live Births   0               Home Medications    Prior to Admission medications   Medication Sig Start Date End Date Taking? Authorizing Provider  metroNIDAZOLE (FLAGYL) 500 MG tablet Take 1 tablet (500 mg total) by mouth 2 (two) times daily. 03/25/16   Lona Kettle, PA-C    Family History No family history on file.  Social History Social History  Substance Use Topics  . Smoking status: Never Smoker  . Smokeless tobacco: Never Used  . Alcohol use Yes     Comment: several times/year     Allergies   Review of patient's allergies indicates no known allergies.   Review of Systems Review of Systems  Constitutional: Negative for chills, diaphoresis and fever.  HENT: Negative for trouble swallowing.   Eyes: Negative for visual disturbance.  Respiratory: Negative for shortness of breath.   Cardiovascular: Negative for chest pain.  Gastrointestinal: Negative for abdominal pain, blood in stool, constipation, diarrhea, nausea and vomiting.  Genitourinary: Positive for vaginal bleeding. Negative for dysuria, hematuria, pelvic pain, vaginal discharge and vaginal pain.  Musculoskeletal: Positive for back pain. Negative for neck pain.  Skin: Negative for rash.  Neurological: Negative for  dizziness, syncope, weakness, light-headedness, numbness and headaches.     Physical Exam Updated Vital Signs BP 139/90 (BP Location: Left Arm)   Pulse 78   Temp 98.4 F (36.9 C) (Oral)   Resp 16   Ht 5\' 8"  (1.727 m)   Wt 89.4 kg   LMP 02/03/2016   SpO2 100%   BMI 29.95 kg/m   Physical Exam  Constitutional: She appears well-developed and well-nourished. No distress.  HENT:  Head: Normocephalic and atraumatic.  Mouth/Throat: Oropharynx is clear and moist. No oropharyngeal exudate.  Eyes: Conjunctivae and EOM are normal.  Pupils are equal, round, and reactive to light. Right eye exhibits no discharge. Left eye exhibits no discharge. No scleral icterus.  Neck: Normal range of motion. Neck supple.  Cardiovascular: Normal rate, regular rhythm, normal heart sounds and intact distal pulses.   No murmur heard. Pulmonary/Chest: Effort normal and breath sounds normal. No respiratory distress.  Abdominal: Soft. Bowel sounds are normal. There is tenderness. There is no rigidity, no rebound, no guarding and no CVA tenderness.  There is mild TTP of left abdomen. No rigidity, guarding, or peritoneal signs.   Genitourinary: Pelvic exam was performed with patient supine. There is no rash, tenderness, lesion or injury on the right labia. There is no rash, tenderness, lesion or injury on the left labia. Cervix exhibits discharge. Cervix exhibits no motion tenderness and no friability. Right adnexum displays no mass and no tenderness. Left adnexum displays no mass and no tenderness. No erythema or tenderness in the vagina. No foreign body in the vagina. No signs of injury around the vagina. No vaginal discharge found.  Genitourinary Comments: Chaperone present for duration of exam. External anatomy normal - no injury, lesions, masses, or rashes. No bleeding, lesions, masses, or ulcerations in vaginal cavity. Cervix is closed with scant bloody discharge. No friability. No CMT, no adnexal tenderness, no masses palpated on bimanual exam.   Musculoskeletal: Normal range of motion.  Lymphadenopathy:    She has no cervical adenopathy.  Neurological: She is alert. She has normal strength. She is not disoriented. No sensory deficit. Coordination normal. GCS eye subscore is 4. GCS verbal subscore is 5. GCS motor subscore is 6.  Skin: Skin is warm and dry. She is not diaphoretic.  Psychiatric: She has a normal mood and affect. Her behavior is normal.     ED Treatments / Results  Labs (all labs ordered are listed, but only abnormal results  are displayed) Labs Reviewed  WET PREP, GENITAL - Abnormal; Notable for the following:       Result Value   Clue Cells Wet Prep HPF POC FEW (*)    WBC, Wet Prep HPF POC FEW (*)    All other components within normal limits  URINALYSIS, ROUTINE W REFLEX MICROSCOPIC (NOT AT [facility]) - Abnormal; Notable for the following:    Hgb urine dipstick SMALL (*)    All other components within normal limits  URINE MICROSCOPIC-ADD ON - Abnormal; Notable for the following:    Squamous Epithelial / LPF 0-5 (*)    Bacteria, UA RARE (*)    All other components within normal limits  CBC WITH DIFFERENTIAL/PLATELET - Abnormal; Notable for the following:    Hemoglobin 11.8 (*)    HCT 35.5 (*)    All other components within normal limits  COMPREHENSIVE METABOLIC PANEL - Abnormal; Notable for the following:    Glucose, Bld 106 (*)    Calcium 8.5 (*)    Total Bilirubin 0.2 (*)  All other components within normal limits  PREGNANCY, URINE  LIPASE, BLOOD  GC/CHLAMYDIA PROBE AMP ([place]) NOT AT [facility]    EKG  EKG Interpretation None       Radiology No results found.  Procedures Procedures (including critical care time)  Medications Ordered in ED Medications  ibuprofen (ADVIL,MOTRIN) tablet 600 mg (600 mg Oral Given 03/25/16 1157)     Initial Impression / Assessment and Plan / ED Course  I have reviewed the triage vital signs and the nursing notes.  Pertinent labs & imaging results that were available during my care of the patient were reviewed by me and considered in my medical decision making (see chart for details).  Clinical Course    Patient presents to ED with complaint of vaginal bleeding. Patient is afebrile and non-toxic appearing in NAD. VSS. Mild TTP of left abdomen without guarding, rigidity, or peritoneal signs. Abdomen is soft and +BS. Tenderness only when asked "does this hurt?" Low suspicion for perforation or obstruction. Pelvic remarkable for scant vaginal bleeding. Will  check labs, pregnancy test, and wet prep. Ibuprofen given.   Lipase normal - low suspicion for pancreatitis. CMP re-assuring - low suspicion for acute cholecystitis or cholangitis. CBC re-assuring. Low suspicion for appendicitis - afebrile, no N/V, no TTP in RLQ, and no leukocytosis. Urine pregnancy test negative - doubt ectopic. U/A negative for UTI. No TTP or masses noted on bimanual exam, no fever, no leukocytosis - low suspicion for TOA or torsion. Wet prep positive for clue cells - will treat for BV. GC/chlamydia pending. Unclear etiology of abdominal pain; however, low suspicion of surgical abdomen. Do not think imaging of abdomen warranted at this time. Suspect vaginal bleeding may be AUB - unclear cause.   Discussed results and plan with patient. Discussed symptomatic management to include NSAIDs. Rx ABX for BV. Encouraged follow up with OBGYN next week for re-evaluation. Return precautions given. Patient voiced understanding and is agreeable.    Final Clinical Impressions(s) / ED Diagnoses   Final diagnoses:  Abnormal vaginal bleeding  Abdominal pain, unspecified abdominal location    New Prescriptions Discharge Medication List as of 03/25/2016 12:59 PM    START taking these medications   Details  metroNIDAZOLE (FLAGYL) 500 MG tablet Take 1 tablet (500 mg total) by mouth 2 (two) times daily., Starting Fri 03/25/2016, Print         Lona Kettle, PA-C 03/27/16 0008    Melene Plan, DO 03/27/16 1610

## 2016-03-25 NOTE — ED Triage Notes (Signed)
Pt reports irregular menstrual cycles since end of July (states she's currently spotting). Also reports lower back pain x2wks. Reports intermittent abd pain x1 mo. Denies fever, n/v/d, abnormal vaginal discharge, urinary symptoms.

## 2016-03-29 LAB — GC/CHLAMYDIA PROBE AMP (~~LOC~~) NOT AT ARMC
CHLAMYDIA, DNA PROBE: NEGATIVE
NEISSERIA GONORRHEA: NEGATIVE

## 2016-08-09 ENCOUNTER — Encounter (HOSPITAL_BASED_OUTPATIENT_CLINIC_OR_DEPARTMENT_OTHER): Payer: Self-pay | Admitting: *Deleted

## 2016-08-09 ENCOUNTER — Emergency Department (HOSPITAL_BASED_OUTPATIENT_CLINIC_OR_DEPARTMENT_OTHER)
Admission: EM | Admit: 2016-08-09 | Discharge: 2016-08-09 | Disposition: A | Discharge status: Home / Self Care | Payer: Medicaid Other | Attending: Emergency Medicine | Admitting: Emergency Medicine

## 2016-08-09 DIAGNOSIS — N3001 Acute cystitis with hematuria: Secondary | ICD-10-CM | POA: Insufficient documentation

## 2016-08-09 LAB — URINALYSIS, ROUTINE W REFLEX MICROSCOPIC
Bilirubin Urine: NEGATIVE
Glucose, UA: 100 mg/dL — AB
KETONES UR: NEGATIVE mg/dL
NITRITE: NEGATIVE
PROTEIN: NEGATIVE mg/dL
Specific Gravity, Urine: 1.016 (ref 1.005–1.030)
pH: 6.5 (ref 5.0–8.0)

## 2016-08-09 LAB — URINALYSIS, MICROSCOPIC (REFLEX)

## 2016-08-09 LAB — PREGNANCY, URINE: PREG TEST UR: NEGATIVE

## 2016-08-09 MED ORDER — CIPROFLOXACIN HCL 500 MG PO TABS: 500.0000 mg | ORAL_TABLET | Freq: Two times a day (BID) | ORAL | 0 refills | Status: DC

## 2016-08-09 MED ORDER — PHENAZOPYRIDINE HCL 200 MG PO TABS: 200.0000 mg | ORAL_TABLET | Freq: Three times a day (TID) | ORAL | 0 refills | Status: DC

## 2016-08-09 NOTE — ED Provider Notes (Signed)
[facility]   CSN: [id number] Arrival date & time: 08/09/16  1051  History   Chief Complaint Chief Complaint  Patient presents with  . Abdominal Pain  . Dysuria    HPI Amanda Rubio is a 26 y.o. female.  HPI  Ms. Talamo is a 26 yo female with no significant PMH who presents today for 1 week of dysuria and lower abdominal pain.  She has had UTIs in the past and states that the current pain with urination feels similar to those. She endorses LLQ and suprapubic abdominal pain that started within the past week and radiates to her back on the left side.  This pain 'comes and goes' and has been worsening over the past week.  It is worse when laying on her stomach.  There are no relieving factors. She rates the pain an 8/10 and has not taken any OTC meds to try and relieve it.  She denies constipation, hemorrhoids or blood in her stool.  She denies hematuria, fevers, nausea, vomiting, chills, sick contacts or increased vaginal discharge.     Past Medical History:  Diagnosis Date  . Medical history non-contributory     Patient Active Problem List   Diagnosis Date Noted  . Left ankle injury 02/17/2016  . Acute blood loss anemia 12/02/2013  . 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 BREAST SURGERY    . BUNIONECTOMY    . DILATION AND EVACUATION N/A 12/02/2013   Procedure: DILATATION AND EVACUATION;  Surgeon: Reva Bores, MD;  Location: [facility];  Service: Gynecology;  Laterality: N/A;  . WISDOM TOOTH EXTRACTION      OB History    Gravida Para Term Preterm AB Living   3 1 1   1 1    SAB TAB Ectopic Multiple Live Births   0               Home Medications    Prior to Admission medications   Medication Sig Start Date End Date Taking? Authorizing Provider  ciprofloxacin (CIPRO) 500 MG tablet Take 1 tablet (500 mg total) by mouth 2 (two) times daily. 08/09/16   Primus Gritton Neva Seat, PA-C    metroNIDAZOLE (FLAGYL) 500 MG tablet Take 1 tablet (500 mg total) by mouth 2 (two) times daily. 03/25/16   Lona Kettle, PA-C  phenazopyridine (PYRIDIUM) 200 MG tablet Take 1 tablet (200 mg total) by mouth 3 (three) times daily. 08/09/16   Marlon Pel, PA-C    Family History No family history on file.  Social History Social History  Substance Use Topics  . Smoking status: Never Smoker  . Smokeless tobacco: Never Used  . Alcohol use Yes     Comment: several times/year     Allergies   Patient has no known allergies.   Review of Systems Review of Systems Review of Systems All other systems negative except as documented in the HPI. All pertinent positives and negatives as reviewed in the HPI.  Physical Exam Updated Vital Signs BP 128/90   Pulse 78   Temp 98.4 F (36.9 C) (Oral)   Resp 20   Ht 5\' 8"  (1.727 m)   Wt 83.9 kg   LMP 07/25/2016   SpO2 100%   BMI 28.13 kg/m   Physical Exam  Constitutional: She appears well-developed and well-nourished. No distress.  HENT:  Head: Normocephalic and atraumatic.  Eyes: Pupils are equal, round, and reactive to  light.  Neck: Normal range of motion. Neck supple.  Cardiovascular: Normal rate and regular rhythm.   Pulmonary/Chest: Effort normal.  Abdominal: Soft.  active bowel sounds in all 4 quadrants and epigastric area.  Tenderness with palpation of LLQ and suprapubic regions.  No guarding or rebound tenderness.  No hepatosplenomegaly. No McBurney's point tenderness.  No palpable masses of stool in L descending colon.  No CVA tenderness.  Neurological: She is alert.  Skin: Skin is warm and dry.  Nursing note and vitals reviewed.   ED Treatments / Results  Labs (all labs ordered are listed, but only abnormal results are displayed) Labs Reviewed  URINALYSIS, ROUTINE W REFLEX MICROSCOPIC - Abnormal; Notable for the following:       Result Value   APPearance CLOUDY (*)    Glucose, UA 100 (*)    Hgb urine dipstick  MODERATE (*)    Leukocytes, UA LARGE (*)    All other components within normal limits  URINALYSIS, MICROSCOPIC (REFLEX) - Abnormal; Notable for the following:    Bacteria, UA FEW (*)    Squamous Epithelial / LPF 6-30 (*)    All other components within normal limits  URINE CULTURE  PREGNANCY, URINE    EKG  EKG Interpretation None       Radiology No results found.  Procedures Procedures (including critical care time)  Medications Ordered in ED Medications - No data to display   Initial Impression / Assessment and Plan / ED Course  I have reviewed the triage vital signs and the nursing notes.  Pertinent labs & imaging results that were available during my care of the patient were reviewed by me and considered in my medical decision making (see chart for details).  Clinical Course    UA revealed UTI.  Suspicion for systemic infection is not high as the patient denies fevers, chills, sweats and VS are stable. No CBC warranted.  Will treat UTI and culture urine. UTI is likely the cause of abdominal pain and history and physical do not suggest a more likely alternative cause.  Will treat conservatively as an ascending UTI with cipro.  UTI, cystitis. Cipro 500mg  BID x14 days Pt told to return if symptoms worsen or fevers, chills develop.    Final Clinical Impressions(s) / ED Diagnoses   Final diagnoses:  Acute cystitis with hematuria    New Prescriptions New Prescriptions   CIPROFLOXACIN (CIPRO) 500 MG TABLET    Take 1 tablet (500 mg total) by mouth 2 (two) times daily.   PHENAZOPYRIDINE (PYRIDIUM) 200 MG TABLET    Take 1 tablet (200 mg total) by mouth 3 (three) times daily.     Marlon Peliffany Benedict Kue, PA-C 08/09/16 1324    Marlon Peliffany Liberty Seto, PA-C 08/09/16 1325    Laurence Spatesachel Morgan Little, MD 08/09/16 [id number]

## 2016-08-09 NOTE — ED Triage Notes (Signed)
Dysuria for a week. Left mid abdominal pain into her back. States she feels she had a UTI.

## 2016-08-11 LAB — URINE CULTURE

## 2016-10-11 ENCOUNTER — Emergency Department (HOSPITAL_BASED_OUTPATIENT_CLINIC_OR_DEPARTMENT_OTHER)
Admission: EM | Admit: 2016-10-11 | Discharge: 2016-10-11 | Disposition: A | Discharge status: Home / Self Care | Payer: Medicaid Other | Attending: Emergency Medicine | Admitting: Emergency Medicine

## 2016-10-11 ENCOUNTER — Encounter (HOSPITAL_BASED_OUTPATIENT_CLINIC_OR_DEPARTMENT_OTHER): Payer: Self-pay | Admitting: *Deleted

## 2016-10-11 DIAGNOSIS — R109 Unspecified abdominal pain: Secondary | ICD-10-CM

## 2016-10-11 DIAGNOSIS — R350 Frequency of micturition: Secondary | ICD-10-CM

## 2016-10-11 DIAGNOSIS — R1084 Generalized abdominal pain: Secondary | ICD-10-CM | POA: Insufficient documentation

## 2016-10-11 LAB — CBC WITH DIFFERENTIAL/PLATELET
BASOS ABS: 0 10*3/uL (ref 0.0–0.1)
Basophils Relative: 1 %
Eosinophils Absolute: 0.1 10*3/uL (ref 0.0–0.7)
Eosinophils Relative: 2 %
HCT: 35.5 % — ABNORMAL LOW (ref 36.0–46.0)
HEMOGLOBIN: 11.3 g/dL — AB (ref 12.0–15.0)
LYMPHS ABS: 1.4 10*3/uL (ref 0.7–4.0)
LYMPHS PCT: 42 %
MCH: 27.4 pg (ref 26.0–34.0)
MCHC: 31.8 g/dL (ref 30.0–36.0)
MCV: 86 fL (ref 78.0–100.0)
Monocytes Absolute: 0.3 10*3/uL (ref 0.1–1.0)
Monocytes Relative: 10 %
NEUTROS PCT: 45 %
Neutro Abs: 1.5 10*3/uL — ABNORMAL LOW (ref 1.7–7.7)
Platelets: 229 10*3/uL (ref 150–400)
RBC: 4.13 MIL/uL (ref 3.87–5.11)
RDW: 13.5 % (ref 11.5–15.5)
WBC: 3.4 10*3/uL — AB (ref 4.0–10.5)

## 2016-10-11 LAB — URINALYSIS, ROUTINE W REFLEX MICROSCOPIC
BILIRUBIN URINE: NEGATIVE
GLUCOSE, UA: NEGATIVE mg/dL
HGB URINE DIPSTICK: NEGATIVE
KETONES UR: NEGATIVE mg/dL
Leukocytes, UA: NEGATIVE
Nitrite: NEGATIVE
PROTEIN: NEGATIVE mg/dL
Specific Gravity, Urine: 1.02 (ref 1.005–1.030)
pH: 6.5 (ref 5.0–8.0)

## 2016-10-11 LAB — COMPREHENSIVE METABOLIC PANEL
ALT: 14 U/L (ref 14–54)
ANION GAP: 7 (ref 5–15)
AST: 19 U/L (ref 15–41)
Albumin: 3.7 g/dL (ref 3.5–5.0)
Alkaline Phosphatase: 57 U/L (ref 38–126)
BILIRUBIN TOTAL: 0.7 mg/dL (ref 0.3–1.2)
BUN: 10 mg/dL (ref 6–20)
CHLORIDE: 106 mmol/L (ref 101–111)
CO2: 25 mmol/L (ref 22–32)
Calcium: 8.4 mg/dL — ABNORMAL LOW (ref 8.9–10.3)
Creatinine, Ser: 0.81 mg/dL (ref 0.44–1.00)
GFR calc non Af Amer: >60 mL/min (ref >60)
Glucose, Bld: 114 mg/dL — ABNORMAL HIGH (ref 65–99)
POTASSIUM: 3.2 mmol/L — AB (ref 3.5–5.1)
Sodium: 138 mmol/L (ref 135–145)
TOTAL PROTEIN: 7.1 g/dL (ref 6.5–8.1)

## 2016-10-11 LAB — PREGNANCY, URINE: PREG TEST UR: NEGATIVE

## 2016-10-11 NOTE — ED Notes (Signed)
Pt also reports fatigue and is concerned about iron levels; reports needing blood transfusion several years ago.

## 2016-10-11 NOTE — ED Triage Notes (Signed)
Pt reports intermittent abd pain only when lying xseveral months. Reports urinary frequency, urgency. Denies dysuria, hematuria, abnormal vaginal discharge/bleeding. Denies fever, n/v/d.

## 2016-10-11 NOTE — ED Notes (Signed)
ED Provider at bedside. 

## 2016-10-11 NOTE — ED Provider Notes (Signed)
[facility] Provider Note   CSN: [id number] Arrival date & time: 10/11/16 0807     History    Chief Complaint  Patient presents with  . Abdominal Pain  . Fatigue     HPI Amanda Rubio is a 26 y.o. female.  26yo F who p/w Abdominal pain and urinary frequency. The patient reports recent urinary frequency without any associated dysuria or hematuria. No vaginal bleeding or discharge. She also reports a 2 month history of intermittent generalized central abdominal pain that occurs only when she lies flat and improves when she is sitting up. The pain is not associated with eating and she denies any associated burning or heartburn sensation. No vomiting, diarrhea, constipation, or fevers.   Past Medical History:  Diagnosis Date  . Medical history non-contributory      Patient Active Problem List   Diagnosis Date Noted  . Left ankle injury 02/17/2016  . Acute blood loss anemia 12/02/2013  . 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 BREAST SURGERY    . BUNIONECTOMY    . DILATION AND EVACUATION N/A 12/02/2013   Procedure: DILATATION AND EVACUATION;  Surgeon: Reva Boresanya S Pratt, MD;  Location: [facility];  Service: Gynecology;  Laterality: N/A;  . WISDOM TOOTH EXTRACTION      OB History    Gravida Para Term Preterm AB Living   3 1 1   1 1    SAB TAB Ectopic Multiple Live Births   0                Home Medications    Prior to Admission medications   Medication Sig Start Date End Date Taking? Authorizing Provider  ciprofloxacin (CIPRO) 500 MG tablet Take 1 tablet (500 mg total) by mouth 2 (two) times daily. 08/09/16   Tiffany Neva SeatGreene, PA-C  metroNIDAZOLE (FLAGYL) 500 MG tablet Take 1 tablet (500 mg total) by mouth 2 (two) times daily. 03/25/16   Lona KettleAshley Laurel Meyer, PA-C  phenazopyridine (PYRIDIUM) 200 MG tablet Take 1 tablet (200 mg total) by mouth 3 (three) times daily. 08/09/16   Marlon Peliffany Greene, PA-C        No family history on file.   Social History  Substance Use Topics  . Smoking status: Never Smoker  . Smokeless tobacco: Never Used  . Alcohol use Yes     Comment: several times/year     Allergies     Patient has no known allergies.    Review of Systems  10 Systems reviewed and are negative for acute change except as noted in the HPI.   Physical Exam Updated Vital Signs BP 127/89 (BP Location: Left Arm)   Pulse 74   Temp 98.3 F (36.8 C) (Oral)   Resp 14   Ht 5\' 8"  (1.727 m)   Wt 185 lb (83.9 kg)   LMP 09/25/2016 (Exact Date)   SpO2 100%   BMI 28.13 kg/m   Physical Exam  Constitutional: She is oriented to person, place, and time. She appears well-developed and well-nourished. No distress.  HENT:  Head: Normocephalic and atraumatic.  Mouth/Throat: Oropharynx is clear and moist.  Moist mucous membranes  Eyes: Conjunctivae are normal. Pupils are equal, round, and reactive to light.  Neck: Neck supple.  Cardiovascular: Normal rate, regular rhythm and normal heart sounds.   No murmur heard. Pulmonary/Chest: Effort normal and breath sounds normal.  Abdominal: Soft. Bowel sounds are normal. She exhibits no distension. There  is no tenderness.  Musculoskeletal: She exhibits no edema.  Neurological: She is alert and oriented to person, place, and time.  Fluent speech  Skin: Skin is warm and dry.  Psychiatric: She has a normal mood and affect. Judgment normal.  Nursing note and vitals reviewed.     ED Treatments / Results  Labs (all labs ordered are listed, but only abnormal results are displayed) Labs Reviewed  URINALYSIS, ROUTINE W REFLEX MICROSCOPIC - Abnormal; Notable for the following:       Result Value   APPearance CLOUDY (*)    All other components within normal limits  COMPREHENSIVE METABOLIC PANEL - Abnormal; Notable for the following:    Potassium 3.2 (*)    Glucose, Bld 114 (*)    Calcium 8.4 (*)    All other components within normal  limits  CBC WITH DIFFERENTIAL/PLATELET - Abnormal; Notable for the following:    WBC 3.4 (*)    Hemoglobin 11.3 (*)    HCT 35.5 (*)    Neutro Abs 1.5 (*)    All other components within normal limits  URINE CULTURE  PREGNANCY, URINE     EKG  EKG Interpretation  Date/Time:    Ventricular Rate:    PR Interval:    QRS Duration:   QT Interval:    QTC Calculation:   R Axis:     Text Interpretation:           Radiology No results found.  Procedures Procedures (including critical care time) Procedures  Medications Ordered in ED  Medications - No data to display   Initial Impression / Assessment and Plan / ED Course  I have reviewed the triage vital signs and the nursing notes.  Pertinent labs that were available during my care of the patient were reviewed by me and considered in my medical decision making (see chart for details).     Pt w/ 2 months of abdominal pain only when she lies flat, not associated with eating or any other symptoms, as well as recent urinary frequency. She was well-appearing on exam with normal vital signs. Abdomen soft and nontender. Her lab work here is reassuring with no evidence of urinary tract infection. CMP unremarkable. Mild anemia and patient is currently taking prenatal vitamins, has history of anemia. Given that her pain only occurs with a certain position, I do not feel her abd pain is related to life-threatening abd process and I do not feel she needs imaging at this time. Discussed supportive measures as well as return precautions. Patient voiced understanding and was discharged in satisfactory condition.  Final Clinical Impressions(s) / ED Diagnoses   Final diagnoses:  Intermittent abdominal pain  Urinary frequency     Discharge Medication List as of 10/11/2016  9:42 AM         Laurence Spates, MD 10/11/16 1044

## 2016-10-12 LAB — URINE CULTURE: SPECIAL REQUESTS: NORMAL

## 2016-12-11 ENCOUNTER — Emergency Department (HOSPITAL_BASED_OUTPATIENT_CLINIC_OR_DEPARTMENT_OTHER)
Admission: EM | Admit: 2016-12-11 | Discharge: 2016-12-11 | Disposition: A | Discharge status: Home / Self Care | Payer: Medicaid Other | Attending: Emergency Medicine | Admitting: Emergency Medicine

## 2016-12-11 ENCOUNTER — Encounter (HOSPITAL_BASED_OUTPATIENT_CLINIC_OR_DEPARTMENT_OTHER): Payer: Self-pay | Admitting: *Deleted

## 2016-12-11 DIAGNOSIS — R2242 Localized swelling, mass and lump, left lower limb: Secondary | ICD-10-CM | POA: Insufficient documentation

## 2016-12-11 MED ORDER — IBUPROFEN 800 MG PO TABS: 800.0000 mg | ORAL_TABLET | Freq: Three times a day (TID) | ORAL | 0 refills | Status: DC | PRN

## 2016-12-11 NOTE — ED Provider Notes (Signed)
Emergency Department Provider Note   I have reviewed the triage vital signs and the nursing notes.   HISTORY  Chief Complaint Abscess (left lateral leg)   HPI Amanda Rubio is a 26 y.o. female presents [facility] for evaluation of left thigh mass with some mild pain. Symptoms began a week ago and has been gradually worsening. No fevers or chills. No redness in the area. No similar masses in the past. Patient states symptoms are worse when standing. No radiation of symptoms. No alleviating factors.    Past Medical History:  Diagnosis Date  . Medical history non-contributory     Patient Active Problem List   Diagnosis Date Noted  . Left ankle injury 02/17/2016  . Acute blood loss anemia 12/02/2013  . 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 BREAST SURGERY    . BUNIONECTOMY    . DILATION AND EVACUATION N/A 12/02/2013   Procedure: DILATATION AND EVACUATION;  Surgeon: Reva Boresanya S Pratt, MD;  Location: [facility];  Service: Gynecology;  Laterality: N/A;  . WISDOM TOOTH EXTRACTION      Current Outpatient Rx  . Order #: [id number] Class: Print  . Order #: [id number] Class: Print  . Order #: [id number] Class: Print  . Order #: [id number] Class: Print    Allergies Patient has no known allergies.  No family history on file.  Social History Social History  Substance Use Topics  . Smoking status: Never Smoker  . Smokeless tobacco: Never Used  . Alcohol use Yes     Comment: several times/year    Review of Systems  Constitutional: No fever/chills Eyes: No visual changes. ENT: No sore throat. Cardiovascular: Denies chest pain. Respiratory: Denies shortness of breath. Gastrointestinal: No abdominal pain.  No nausea, no vomiting.  No diarrhea.  No constipation. Genitourinary: Negative for dysuria. Musculoskeletal: Negative for back pain. Positive left thigh swelling and pain.  Skin:  Negative for rash. Neurological: Negative for headaches, focal weakness or numbness.  10-point ROS otherwise negative.  ____________________________________________   PHYSICAL EXAM:  VITAL SIGNS: ED Triage Vitals  Enc Vitals Group     BP 12/11/16 1052 (!) 141/88     Pulse Rate 12/11/16 1052 80     Resp 12/11/16 1052 18     Temp 12/11/16 1052 99.1 F (37.3 C)     Temp Source 12/11/16 1052 Oral     SpO2 12/11/16 1052 100 %     Weight 12/11/16 1051 185 lb (83.9 kg)     Height 12/11/16 1051 5\' 8"  (1.727 m)     Pain Score 12/11/16 1055 8    Constitutional: Alert and oriented. Well appearing and in no acute distress. Eyes: Conjunctivae are normal.  Head: Atraumatic. Nose: No congestion/rhinnorhea. Mouth/Throat: Mucous membranes are moist.  Neck: No stridor.  Cardiovascular: Normal rate, regular rhythm. Good peripheral circulation. Grossly normal heart sounds.   Respiratory: Normal respiratory effort.  No retractions. Lungs CTAB. Gastrointestinal: Soft and nontender. No distention.  Musculoskeletal: No lower extremity tenderness nor edema. No gross deformities of extremities. 3 x 3 cm area of induration deep to subcutaneous tissues. No overlying cellulitis.  Neurologic:  Normal speech and language. No gross focal neurologic deficits are appreciated.  Skin:  Skin is warm, dry and intact. No rash noted. Psychiatric: Mood and affect are normal. Speech and behavior are normal.  ____________________________________________   PROCEDURES  Procedure(s) performed:   Procedures  ULTRASOUND LIMITED SOFT TISSUE/ MUSCULOSKELETAL:  Indication: Left thigh mass Linear probe used to evaluate area of interest in two planes. Findings:  No evidence of organized fluid collection (abscess) or cellulitis.  Performed by: Dr Jacqulyn Bath Images saved electronically  ____________________________________________   INITIAL IMPRESSION / ASSESSMENT AND PLAN / ED COURSE  Pertinent labs & imaging  results that were available during my care of the patient were reviewed by me and considered in my medical decision making (see chart for details).  Patient presents with a small mass to the left lateral thigh. Is difficult to appreciate any swelling visually. On palpation there is a small amount of firmness in the deep tissues. No overlying erythema, fluctuance, cellulitis. Mild tenderness to palpation. Bedside ultrasound does not demonstrate an abscess or fluid collection. Plan for warm compresses and PCP follow up for consideration or derm/surgery referral for removal of possible lipoma.   At this time, I do not feel there is any life-threatening condition present. I have reviewed and discussed all results (EKG, imaging, lab, urine as appropriate), exam findings with patient. I have reviewed nursing notes and appropriate previous records.  I feel the patient is safe to be discharged home without further emergent workup. Discussed usual and customary return precautions. Patient and family (if present) verbalize understanding and are comfortable with this plan.  Patient will follow-up with their primary care provider. If they do not have a primary care provider, information for follow-up has been provided to them. All questions have been answered.  ____________________________________________  FINAL CLINICAL IMPRESSION(S) / ED DIAGNOSES  Final diagnoses:  Mass of left thigh     MEDICATIONS GIVEN DURING THIS VISIT:  Medications - No data to display   NEW OUTPATIENT MEDICATIONS STARTED DURING THIS VISIT:  Discharge Medication List as of 12/11/2016 11:27 AM    START taking these medications   Details  ibuprofen (ADVIL,MOTRIN) 800 MG tablet Take 1 tablet (800 mg total) by mouth every 8 (eight) hours as needed., Starting Sun 12/11/2016, Print        Note:  This document was prepared using Dragon voice recognition software and may include unintentional dictation errors.  Alona Bene,  MD Emergency Medicine   Long, Arlyss Repress, MD 12/12/16 1535

## 2016-12-11 NOTE — Discharge Instructions (Signed)
You were seen in the ED with left thigh mass. We found no evidence of abscess on ultrasound of the leg. Follow up with your PCP in the coming week. Continue warm compresses to the area and Tylenol/Motrin for pain. This may be a small, benign, fatty tumor called a lipoma but more specialist evaluation is needed.   Return to the ED with any new or worsening pain, redness to the area, fever, or sudden enlarging of the mass.

## 2016-12-11 NOTE — ED Triage Notes (Signed)
Patient states she has a hard raised area on the left lateral thigh for the last several weeks.  States it feels like it is getting deeper.

## 2017-01-05 ENCOUNTER — Encounter (HOSPITAL_BASED_OUTPATIENT_CLINIC_OR_DEPARTMENT_OTHER): Payer: Self-pay | Admitting: Emergency Medicine

## 2017-01-05 ENCOUNTER — Emergency Department (HOSPITAL_BASED_OUTPATIENT_CLINIC_OR_DEPARTMENT_OTHER)
Admission: EM | Admit: 2017-01-05 | Discharge: 2017-01-05 | Disposition: A | Discharge status: Home / Self Care | Payer: Medicaid Other | Attending: Physician Assistant | Admitting: Physician Assistant

## 2017-01-05 DIAGNOSIS — G43009 Migraine without aura, not intractable, without status migrainosus: Secondary | ICD-10-CM | POA: Insufficient documentation

## 2017-01-05 DIAGNOSIS — B9689 Other specified bacterial agents as the cause of diseases classified elsewhere: Secondary | ICD-10-CM

## 2017-01-05 DIAGNOSIS — Z79899 Other long term (current) drug therapy: Secondary | ICD-10-CM | POA: Insufficient documentation

## 2017-01-05 DIAGNOSIS — N760 Acute vaginitis: Secondary | ICD-10-CM | POA: Insufficient documentation

## 2017-01-05 LAB — WET PREP, GENITAL
Sperm: NONE SEEN
Trich, Wet Prep: NONE SEEN
Yeast Wet Prep HPF POC: NONE SEEN

## 2017-01-05 LAB — URINALYSIS, ROUTINE W REFLEX MICROSCOPIC
BILIRUBIN URINE: NEGATIVE
Glucose, UA: NEGATIVE mg/dL
HGB URINE DIPSTICK: NEGATIVE
KETONES UR: NEGATIVE mg/dL
Leukocytes, UA: NEGATIVE
NITRITE: NEGATIVE
PROTEIN: NEGATIVE mg/dL
SPECIFIC GRAVITY, URINE: 1.025 (ref 1.005–1.030)
pH: 6 (ref 5.0–8.0)

## 2017-01-05 LAB — PREGNANCY, URINE: PREG TEST UR: NEGATIVE

## 2017-01-05 MED ORDER — METRONIDAZOLE 500 MG PO TABS
500.0000 mg | ORAL_TABLET | Freq: Once | ORAL | Status: AC
  Administered 2017-01-05: 500 mg via ORAL
  Filled 2017-01-05: qty 1

## 2017-01-05 MED ORDER — METRONIDAZOLE 500 MG PO TABS: 500.0000 mg | ORAL_TABLET | Freq: Two times a day (BID) | ORAL | 0 refills | Status: DC

## 2017-01-05 MED ORDER — SODIUM CHLORIDE 0.9 % IV BOLUS (SEPSIS)
1000.0000 mL | Freq: Once | INTRAVENOUS | Status: AC
  Administered 2017-01-05: 1000 mL via INTRAVENOUS

## 2017-01-05 MED ORDER — PROCHLORPERAZINE EDISYLATE 5 MG/ML IJ SOLN
10.0000 mg | Freq: Once | INTRAMUSCULAR | Status: AC
  Administered 2017-01-05: 10 mg via INTRAVENOUS
  Filled 2017-01-05: qty 2

## 2017-01-05 MED ORDER — DIPHENHYDRAMINE HCL 50 MG/ML IJ SOLN
25.0000 mg | Freq: Once | INTRAMUSCULAR | Status: AC
  Administered 2017-01-05: 25 mg via INTRAVENOUS
  Filled 2017-01-05: qty 1

## 2017-01-05 NOTE — ED Notes (Signed)
ED Provider at bedside. 

## 2017-01-05 NOTE — ED Triage Notes (Signed)
Headache x 2 days around the L eye, unrelieved by ibuprofen. Also c/o vaginal itching, denies discharge.

## 2017-01-05 NOTE — ED Provider Notes (Signed)
[facility]   CSN: [id number] Arrival date & time: 01/05/17  1001     History   Chief Complaint Chief Complaint  Patient presents with  . Headache  . Vaginal Itching    HPI Amanda Rubio is a 26 y.o. female.  HPI   Patient is a 26 year old female presenting with headache and vaginal completes. Patient reports she's has worsening headache on the left-hand side behind her left eye. She reports she has history of migraines. This is associated blurry vision with migraine. Typical for migraine. No recent trauma.  Pt reports that she thinks she "caught ayeast infection early". She has mild itching and swelling but no discharge.  Past Medical History:  Diagnosis Date  . Medical history non-contributory     Patient Active Problem List   Diagnosis Date Noted  . Left ankle injury 02/17/2016  . Acute blood loss anemia 12/02/2013  . 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 BREAST SURGERY    . BUNIONECTOMY    . DILATION AND EVACUATION N/A 12/02/2013   Procedure: DILATATION AND EVACUATION;  Surgeon: Reva Bores, MD;  Location: [facility];  Service: Gynecology;  Laterality: N/A;  . WISDOM TOOTH EXTRACTION      OB History    Gravida Para Term Preterm AB Living   4 1 1   1 1    SAB TAB Ectopic Multiple Live Births   0               Home Medications    Prior to Admission medications   Medication Sig Start Date End Date Taking? Authorizing Provider  ciprofloxacin (CIPRO) 500 MG tablet Take 1 tablet (500 mg total) by mouth 2 (two) times daily. 08/09/16   Marlon Pel, PA-C  ibuprofen (ADVIL,MOTRIN) 800 MG tablet Take 1 tablet (800 mg total) by mouth every 8 (eight) hours as needed. 12/11/16   Long, Arlyss Repress, MD  metroNIDAZOLE (FLAGYL) 500 MG tablet Take 1 tablet (500 mg total) by mouth 2 (two) times daily. 03/25/16   Deborha Payment, PA-C  phenazopyridine (PYRIDIUM) 200 MG tablet  Take 1 tablet (200 mg total) by mouth 3 (three) times daily. 08/09/16   Marlon Pel, PA-C    Family History No family history on file.  Social History Social History  Substance Use Topics  . Smoking status: Never Smoker  . Smokeless tobacco: Never Used  . Alcohol use Yes     Comment: several times/year     Allergies   Patient has no known allergies.   Review of Systems Review of Systems  Constitutional: Negative for activity change.  Respiratory: Negative for shortness of breath.   Cardiovascular: Negative for chest pain.  Gastrointestinal: Negative for abdominal pain.  Genitourinary: Negative for difficulty urinating, dyspareunia and vaginal discharge.  Neurological: Positive for headaches.     Physical Exam Updated Vital Signs BP (!) 132/97 (BP Location: Right Arm)   Pulse 73   Temp 98.4 F (36.9 C) (Oral)   Resp 16   Ht 5\' 8"  (1.727 m)   Wt 83.9 kg (185 lb)   LMP 12/24/2016   SpO2 100%   BMI 28.13 kg/m   Physical Exam  Constitutional: She is oriented to person, place, and time. She appears well-developed and well-nourished.  HENT:  Head: Normocephalic and atraumatic.  Eyes: Right eye exhibits no discharge.  Cardiovascular: Normal rate, regular rhythm and normal heart sounds.  No murmur heard. Pulmonary/Chest: Effort normal and breath sounds normal. She has no wheezes. She has no rales.  Abdominal: Soft. She exhibits no distension. There is no tenderness.  Neurological: She is oriented to person, place, and time.  Equal strength bilaterally upper and lower extremities negative pronator drift. Normal sensation bilaterally. Speech comprehensible, no slurring. Facial nerve tested and appears grossly normal. Alert and oriented 3.   Skin: Skin is warm and dry. She is not diaphoretic.  Psychiatric: She has a normal mood and affect.  Nursing note and vitals reviewed.    ED Treatments / Results  Labs (all labs ordered are listed, but only abnormal  results are displayed) Labs Reviewed  WET PREP, GENITAL  URINALYSIS, ROUTINE W REFLEX MICROSCOPIC  PREGNANCY, URINE  RPR  HIV ANTIBODY (ROUTINE TESTING)  GC/CHLAMYDIA PROBE AMP ([place]    EKG  EKG Interpretation None       Radiology No results found.  Procedures Procedures (including critical care time)  Medications Ordered in ED Medications  sodium chloride 0.9 % bolus 1,000 mL (not administered)  prochlorperazine (COMPAZINE) injection 10 mg (not administered)  diphenhydrAMINE (BENADRYL) injection 25 mg (not administered)     Initial Impression / Assessment and Plan / ED Course  I have reviewed the triage vital signs and the nursing notes.  Pertinent labs & imaging results that were available during my care of the patient were reviewed by me and considered in my medical decision making (see chart for details).     Patient is a [age] old female presenting with headache. This is typical for migraine. Do not suspect any intracranial issues. She has normal neurologic exam. We'll treat with migraine cocktail.   Patient also complains of vaginal itching/swelling. We'll send wet prep and STD screening.  Shows BV, will treat.   Final Clinical Impressions(s) / ED Diagnoses   Final diagnoses:  None    New Prescriptions New Prescriptions   No medications on file     Abelino DerrickMackuen, Kendel Bessey Lyn, MD 01/05/17 1521

## 2017-01-06 LAB — HIV ANTIBODY (ROUTINE TESTING W REFLEX): HIV SCREEN 4TH GENERATION: NONREACTIVE

## 2017-01-06 LAB — RPR: RPR Ser Ql: NONREACTIVE

## 2017-01-06 LAB — GC/CHLAMYDIA PROBE AMP (~~LOC~~) NOT AT ARMC
Chlamydia: NEGATIVE
Neisseria Gonorrhea: NEGATIVE

## 2017-03-20 ENCOUNTER — Encounter (HOSPITAL_BASED_OUTPATIENT_CLINIC_OR_DEPARTMENT_OTHER): Payer: Self-pay

## 2017-03-20 DIAGNOSIS — Z5321 Procedure and treatment not carried out due to patient leaving prior to being seen by health care provider: Secondary | ICD-10-CM | POA: Insufficient documentation

## 2017-03-20 DIAGNOSIS — M549 Dorsalgia, unspecified: Secondary | ICD-10-CM | POA: Insufficient documentation

## 2017-03-20 LAB — URINALYSIS, MICROSCOPIC (REFLEX)

## 2017-03-20 LAB — URINALYSIS, ROUTINE W REFLEX MICROSCOPIC
BILIRUBIN URINE: NEGATIVE
Glucose, UA: NEGATIVE mg/dL
KETONES UR: NEGATIVE mg/dL
NITRITE: NEGATIVE
PROTEIN: 30 mg/dL — AB
Specific Gravity, Urine: 1.023 (ref 1.005–1.030)
pH: 5.5 (ref 5.0–8.0)

## 2017-03-20 LAB — PREGNANCY, URINE: PREG TEST UR: NEGATIVE

## 2017-03-20 NOTE — ED Triage Notes (Signed)
Pt c/o worsening bilateral lower back pain for three days with cloudy, scant urine output, no fevers at home, pain appears to be worse with movement

## 2017-03-21 ENCOUNTER — Emergency Department (HOSPITAL_BASED_OUTPATIENT_CLINIC_OR_DEPARTMENT_OTHER)
Admission: EM | Admit: 2017-03-21 | Discharge: 2017-03-21 | Disposition: A | Discharge status: Home / Self Care | Payer: Medicaid Other | Attending: Emergency Medicine | Admitting: Emergency Medicine

## 2017-03-21 ENCOUNTER — Emergency Department (HOSPITAL_BASED_OUTPATIENT_CLINIC_OR_DEPARTMENT_OTHER)
Admission: EM | Admit: 2017-03-21 | Discharge: 2017-03-21 | Disposition: A | Discharge status: Home / Self Care | Payer: Self-pay | Attending: Emergency Medicine | Admitting: Emergency Medicine

## 2017-03-21 ENCOUNTER — Encounter (HOSPITAL_BASED_OUTPATIENT_CLINIC_OR_DEPARTMENT_OTHER): Payer: Self-pay | Admitting: *Deleted

## 2017-03-21 ENCOUNTER — Emergency Department (HOSPITAL_BASED_OUTPATIENT_CLINIC_OR_DEPARTMENT_OTHER): Payer: Self-pay

## 2017-03-21 DIAGNOSIS — R109 Unspecified abdominal pain: Secondary | ICD-10-CM

## 2017-03-21 DIAGNOSIS — R1030 Lower abdominal pain, unspecified: Secondary | ICD-10-CM | POA: Insufficient documentation

## 2017-03-21 LAB — COMPREHENSIVE METABOLIC PANEL
ALT: 9 U/L — ABNORMAL LOW (ref 14–54)
AST: 17 U/L (ref 15–41)
Albumin: 4 g/dL (ref 3.5–5.0)
Alkaline Phosphatase: 55 U/L (ref 38–126)
Anion gap: 6 (ref 5–15)
BUN: 9 mg/dL (ref 6–20)
CHLORIDE: 108 mmol/L (ref 101–111)
CO2: 23 mmol/L (ref 22–32)
Calcium: 8.7 mg/dL — ABNORMAL LOW (ref 8.9–10.3)
Creatinine, Ser: 0.76 mg/dL (ref 0.44–1.00)
Glucose, Bld: 94 mg/dL (ref 65–99)
POTASSIUM: 3.4 mmol/L — AB (ref 3.5–5.1)
Sodium: 137 mmol/L (ref 135–145)
Total Bilirubin: 0.5 mg/dL (ref 0.3–1.2)
Total Protein: 7.1 g/dL (ref 6.5–8.1)

## 2017-03-21 LAB — CBC WITH DIFFERENTIAL/PLATELET
BASOS ABS: 0 10*3/uL (ref 0.0–0.1)
Basophils Relative: 0 %
EOS PCT: 2 %
Eosinophils Absolute: 0.1 10*3/uL (ref 0.0–0.7)
HCT: 38.4 % (ref 36.0–46.0)
Hemoglobin: 12.5 g/dL (ref 12.0–15.0)
LYMPHS PCT: 44 %
Lymphs Abs: 2.3 10*3/uL (ref 0.7–4.0)
MCH: 27.8 pg (ref 26.0–34.0)
MCHC: 32.6 g/dL (ref 30.0–36.0)
MCV: 85.5 fL (ref 78.0–100.0)
MONO ABS: 0.4 10*3/uL (ref 0.1–1.0)
Monocytes Relative: 7 %
Neutro Abs: 2.4 10*3/uL (ref 1.7–7.7)
Neutrophils Relative %: 47 %
PLATELETS: 253 10*3/uL (ref 150–400)
RBC: 4.49 MIL/uL (ref 3.87–5.11)
RDW: 13.9 % (ref 11.5–15.5)
WBC: 5.1 10*3/uL (ref 4.0–10.5)

## 2017-03-21 LAB — URINALYSIS, ROUTINE W REFLEX MICROSCOPIC
Bilirubin Urine: NEGATIVE
Glucose, UA: NEGATIVE mg/dL
Hgb urine dipstick: NEGATIVE
Ketones, ur: NEGATIVE mg/dL
LEUKOCYTES UA: NEGATIVE
Nitrite: NEGATIVE
PROTEIN: NEGATIVE mg/dL
Specific Gravity, Urine: 1.01 (ref 1.005–1.030)
pH: 7 (ref 5.0–8.0)

## 2017-03-21 LAB — LIPASE, BLOOD: LIPASE: 32 U/L (ref 11–51)

## 2017-03-21 MED ORDER — SODIUM CHLORIDE 0.9 % IV BOLUS (SEPSIS)
1000.0000 mL | Freq: Once | INTRAVENOUS | Status: AC
  Administered 2017-03-21: 1000 mL via INTRAVENOUS

## 2017-03-21 MED ORDER — CYCLOBENZAPRINE HCL 5 MG PO TABS: 5.0000 mg | ORAL_TABLET | Freq: Three times a day (TID) | ORAL | 0 refills | Status: DC | PRN

## 2017-03-21 MED ORDER — KETOROLAC TROMETHAMINE 30 MG/ML IJ SOLN
30.0000 mg | Freq: Once | INTRAMUSCULAR | Status: AC
  Administered 2017-03-21: 30 mg via INTRAVENOUS
  Filled 2017-03-21: qty 1

## 2017-03-21 MED ORDER — HYDROCODONE-ACETAMINOPHEN 5-325 MG PO TABS: 1.0000 | ORAL_TABLET | Freq: Four times a day (QID) | ORAL | 0 refills | Status: DC | PRN

## 2017-03-21 MED ORDER — SODIUM CHLORIDE 0.9 % IV BOLUS (SEPSIS): 1000.0000 mL | Freq: Once | INTRAVENOUS | Status: DC

## 2017-03-21 MED ORDER — MORPHINE SULFATE (PF) 4 MG/ML IV SOLN
4.0000 mg | Freq: Once | INTRAVENOUS | Status: AC
  Administered 2017-03-21: 4 mg via INTRAVENOUS
  Filled 2017-03-21: qty 1

## 2017-03-21 MED ORDER — IBUPROFEN 600 MG PO TABS: 600.0000 mg | ORAL_TABLET | Freq: Four times a day (QID) | ORAL | 0 refills | Status: DC | PRN

## 2017-03-21 MED ORDER — ONDANSETRON HCL 4 MG/2ML IJ SOLN
4.0000 mg | Freq: Once | INTRAMUSCULAR | Status: AC
  Administered 2017-03-21: 4 mg via INTRAVENOUS
  Filled 2017-03-21: qty 2

## 2017-03-21 MED FILL — HYDROCODON-APAP 5-325: 5-325 | 2 days supply | Qty: 8 | Fill #0

## 2017-03-21 NOTE — Discharge Instructions (Signed)
Take motrin for pain.   Take flexeril for muscle spasms.   Take vicodin for severe pain. Do not drive with it.   You have no blood in your urine or urinary tract infection on your urinalysis. We sent off urine culture and you will be called if there is any bacteria growing in it.   See your doctor  Return to ER if you have worse flank pain, abdominal pain, fever, dysuria.

## 2017-03-21 NOTE — ED Notes (Signed)
IV assessed and was not running due to pt's arm bent. Repositioned and IV flowing freely now. Pt states pain is returning. Dr. Silverio Lay notified

## 2017-03-21 NOTE — ED Triage Notes (Signed)
Pt reports 3 days of bilateral flank pain, denies any fevers, dysuria, reports urine has "blood streaks" and looks cloudy with foul odor.

## 2017-03-21 NOTE — ED Notes (Signed)
RN Joss informed that Pt did not want to get in gown, want pain medicine now.

## 2017-03-21 NOTE — ED Provider Notes (Signed)
[facility]   CSN: [id number] Arrival date & time: 03/21/17  0750     History   Chief Complaint Chief Complaint  Patient presents with  . Flank Pain    HPI Amanda Rubio is a 26 y.o. female history of previous UTI, here presenting with bilateral flank pain, dysuria, cloudy urine. Patient states that she has dysuria and cloudy urine for the last 2-3 days. Worsening bilateral flank pain especially on the right side since yesterday. Patient came in last night and had a urinalysis that showed some white blood cells but no obvious bacteria or leukocytes or nitrates. Patient left without being seen due to long wait times and came back this morning for persistent pain. Denies any nausea or vomiting or fevers. Denies any history of kidney stones.    The history is provided by the patient.    Past Medical History:  Diagnosis Date  . Medical history non-contributory     Patient Active Problem List   Diagnosis Date Noted  . Left ankle injury 02/17/2016  . Acute blood loss anemia 12/02/2013  . 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 BREAST SURGERY    . BUNIONECTOMY    . DILATION AND EVACUATION N/A 12/02/2013   Procedure: DILATATION AND EVACUATION;  Surgeon: Reva Bores, MD;  Location: [facility];  Service: Gynecology;  Laterality: N/A;  . WISDOM TOOTH EXTRACTION      OB History    Gravida Para Term Preterm AB Living   4 1 1   1 1    SAB TAB Ectopic Multiple Live Births   0               Home Medications    Prior to Admission medications   Not on File    Family History History reviewed. No pertinent family history.  Social History Social History  Substance Use Topics  . Smoking status: Never Smoker  . Smokeless tobacco: Never Used  . Alcohol use Yes     Comment: several times/year     Allergies   Patient has no known allergies.   Review of Systems Review of  Systems  Genitourinary: Positive for dysuria and flank pain.  All other systems reviewed and are negative.    Physical Exam Updated Vital Signs BP 103/73 (BP Location: Left Arm)   Pulse 71   Temp 98.1 F (36.7 C) (Oral)   Resp 16   LMP 02/22/2017 Comment: Negative U-preg yesterday  SpO2 99%   Physical Exam  Constitutional: She is oriented to person, place, and time.  Uncomfortable   HENT:  Head: Normocephalic.  Mouth/Throat: Oropharynx is clear and moist.  Eyes: Pupils are equal, round, and reactive to light. Conjunctivae and EOM are normal.  Neck: Normal range of motion. Neck supple.  Cardiovascular: Normal rate, regular rhythm and normal heart sounds.   Pulmonary/Chest: Effort normal and breath sounds normal. No respiratory distress. She has no wheezes. She has no rales.  Abdominal: Soft. Bowel sounds are normal.  Mild bilateral CVAT, worse on R flank. No abdominal tenderness   Musculoskeletal: Normal range of motion.  Neurological: She is alert and oriented to person, place, and time. No cranial nerve deficit. Coordination normal.  Skin: Skin is warm.  Psychiatric: She has a normal mood and affect.  Nursing note and vitals reviewed.    ED Treatments / Results  Labs (all labs ordered are listed, but only  abnormal results are displayed) Labs Reviewed  COMPREHENSIVE METABOLIC PANEL - Abnormal; Notable for the following:       Result Value   Potassium 3.4 (*)    Calcium 8.7 (*)    ALT 9 (*)    All other components within normal limits  URINE CULTURE  CBC WITH DIFFERENTIAL/PLATELET  LIPASE, BLOOD  URINALYSIS, ROUTINE W REFLEX MICROSCOPIC    EKG  EKG Interpretation None       Radiology Ct Renal Stone Study  Result Date: 03/21/2017 CLINICAL DATA:  Bilateral flank pain and hematuria EXAM: CT ABDOMEN AND PELVIS WITHOUT CONTRAST TECHNIQUE: Multidetector CT imaging of the abdomen and pelvis was performed following the standard protocol without IV contrast.  COMPARISON:  None. FINDINGS: Lower chest: No acute abnormality. Hepatobiliary: No focal liver abnormality is seen. No gallstones, gallbladder wall thickening, or biliary dilatation. Pancreas: Unremarkable. No pancreatic ductal dilatation or surrounding inflammatory changes. Spleen: Normal in size without focal abnormality. Adrenals/Urinary Tract: Adrenal glands are unremarkable. Kidneys are normal, without renal calculi, focal lesion, or hydronephrosis. Bladder is unremarkable. Stomach/Bowel: Stomach is within normal limits. Appendix appears normal. No evidence of bowel wall thickening, distention, or inflammatory changes. Vascular/Lymphatic: No significant vascular findings are present. No enlarged abdominal or pelvic lymph nodes. Reproductive: Uterus and bilateral adnexa are unremarkable. Other: No abdominal wall hernia or abnormality. No abdominopelvic ascites. Musculoskeletal: No acute or significant osseous findings. IMPRESSION: No acute abnormality noted. Electronically Signed   By: Alcide Clever M.D.   On: 03/21/2017 08:36    Procedures Procedures (including critical care time)  Medications Ordered in ED Medications  sodium chloride 0.9 % bolus 1,000 mL (0 mLs Intravenous Stopped 03/21/17 1104)  morphine 4 MG/ML injection 4 mg (4 mg Intravenous Given 03/21/17 0832)  ondansetron (ZOFRAN) injection 4 mg (4 mg Intravenous Given 03/21/17 0832)  ketorolac (TORADOL) 30 MG/ML injection 30 mg (30 mg Intravenous Given 03/21/17 4481)     Initial Impression / Assessment and Plan / ED Course  I have reviewed the triage vital signs and the nursing notes.  Pertinent labs & imaging results that were available during my care of the patient were reviewed by me and considered in my medical decision making (see chart for details).    Amanda Rubio is a 26 y.o. female here with flank pain, dysuria. Consider pyelo vs stone. UA yesterday contaminated, will recollect and get urine culture. Will get labs, CT renal  stone. Will give pain meds and IVF and reassess.   11:49 AM Labs unremarkable. CT unremarkable. Repeat UA showed no UTI or blood. She states that she has blood when she wipes but she denies being on her period and denies having vaginal bleeding. I suspect MSK pain. She is convinced she has UTI. I sent off urine culture. Given that her UA showed no leuk, nitrate or WBC, will hold off on abx. Will dc home with vicodin, flexeril prn.   Final Clinical Impressions(s) / ED Diagnoses   Final diagnoses:  None    New Prescriptions New Prescriptions   No medications on file     Charlynne Pander, MD 03/21/17 1152

## 2017-03-21 NOTE — ED Notes (Addendum)
Pt directed to pharmacy to pick up Rx. Work note given. Pt has a ride at bedside

## 2017-03-21 NOTE — ED Notes (Signed)
Patient unable to void for UA sample.  Patient given water.

## 2017-03-21 NOTE — ED Notes (Signed)
ED Provider at bedside. 

## 2017-03-21 NOTE — ED Notes (Signed)
Pt reported "her pain has been a 10/10 since she got here and noone has done anything for her". Pt also upset with people getting called back to a room before her. This RN explained to pt we were doing the best we could and offered a heating pack. Pt informed she was next to be seen and doctor was aware of her. Pt reported, "Im trying really hard not to be rude, Ill just go".

## 2017-03-21 NOTE — ED Notes (Signed)
Dr Silverio Lay at bedside to assess prior to triage

## 2017-03-22 LAB — URINE CULTURE

## 2018-02-27 ENCOUNTER — Ambulatory Visit (HOSPITAL_BASED_OUTPATIENT_CLINIC_OR_DEPARTMENT_OTHER)
Admission: RE | Admit: 2018-02-27 | Discharge: 2018-02-27 | Disposition: A | Discharge status: Home / Self Care | Payer: Medicaid Other | Source: Ambulatory Visit | Attending: Emergency Medicine | Admitting: Emergency Medicine

## 2018-02-27 ENCOUNTER — Encounter (HOSPITAL_BASED_OUTPATIENT_CLINIC_OR_DEPARTMENT_OTHER): Payer: Self-pay | Admitting: Emergency Medicine

## 2018-02-27 ENCOUNTER — Other Ambulatory Visit: Payer: Self-pay

## 2018-02-27 ENCOUNTER — Other Ambulatory Visit (HOSPITAL_BASED_OUTPATIENT_CLINIC_OR_DEPARTMENT_OTHER): Payer: Self-pay | Admitting: Emergency Medicine

## 2018-02-27 ENCOUNTER — Emergency Department (HOSPITAL_BASED_OUTPATIENT_CLINIC_OR_DEPARTMENT_OTHER)
Admission: EM | Admit: 2018-02-27 | Discharge: 2018-02-27 | Disposition: A | Discharge status: Home / Self Care | Payer: Medicaid Other | Attending: Emergency Medicine | Admitting: Emergency Medicine

## 2018-02-27 DIAGNOSIS — O219 Vomiting of pregnancy, unspecified: Secondary | ICD-10-CM | POA: Diagnosis not present

## 2018-02-27 DIAGNOSIS — O26851 Spotting complicating pregnancy, first trimester: Secondary | ICD-10-CM | POA: Diagnosis not present

## 2018-02-27 DIAGNOSIS — O208 Other hemorrhage in early pregnancy: Principal | ICD-10-CM

## 2018-02-27 DIAGNOSIS — O98319 Other infections with a predominantly sexual mode of transmission complicating pregnancy, unspecified trimester: Secondary | ICD-10-CM | POA: Insufficient documentation

## 2018-02-27 DIAGNOSIS — Z3A01 Less than 8 weeks gestation of pregnancy: Secondary | ICD-10-CM | POA: Diagnosis not present

## 2018-02-27 DIAGNOSIS — R1084 Generalized abdominal pain: Secondary | ICD-10-CM | POA: Insufficient documentation

## 2018-02-27 DIAGNOSIS — O209 Hemorrhage in early pregnancy, unspecified: Secondary | ICD-10-CM

## 2018-02-27 DIAGNOSIS — A599 Trichomoniasis, unspecified: Secondary | ICD-10-CM

## 2018-02-27 LAB — URINALYSIS, MICROSCOPIC (REFLEX)

## 2018-02-27 LAB — URINALYSIS, ROUTINE W REFLEX MICROSCOPIC
BILIRUBIN URINE: NEGATIVE
Glucose, UA: NEGATIVE mg/dL
Hgb urine dipstick: NEGATIVE
Ketones, ur: NEGATIVE mg/dL
Nitrite: NEGATIVE
PROTEIN: NEGATIVE mg/dL
SPECIFIC GRAVITY, URINE: 1.025 (ref 1.005–1.030)
pH: 6 (ref 5.0–8.0)

## 2018-02-27 LAB — PREGNANCY, URINE: PREG TEST UR: POSITIVE — AB

## 2018-02-27 MED ORDER — DOXYLAMINE SUCCINATE (SLEEP) 25 MG PO TABS
25.0000 mg | ORAL_TABLET | Freq: Once | ORAL | Status: DC
  Filled 2018-02-27: qty 1

## 2018-02-27 MED ORDER — PROMETHAZINE HCL 25 MG PO TABS
25.0000 mg | ORAL_TABLET | Freq: Once | ORAL | Status: DC
  Filled 2018-02-27: qty 1

## 2018-02-27 MED ORDER — METRONIDAZOLE 500 MG PO TABS: 500.0000 mg | ORAL_TABLET | Freq: Two times a day (BID) | ORAL | 0 refills | Status: DC

## 2018-02-27 MED ORDER — DOXYLAMINE-PYRIDOXINE 10-10 MG PO TBEC: 1.0000 | DELAYED_RELEASE_TABLET | Freq: Three times a day (TID) | ORAL | 0 refills | Status: DC | PRN

## 2018-02-27 NOTE — ED Provider Notes (Signed)
[facility] EMERGENCY DEPARTMENT Provider Note   CSN: [id number] Arrival date & time: 02/27/18  0306     History   Chief Complaint Chief Complaint  Patient presents with  . Vomiting    HPI Amanda Rubio is a 27 y.o. female.  Patient presents with intermittent nausea and vomiting over the past 3 days.  Reports 4 episodes of vomiting in the past 24 hours.  Denies any blood in her emesis.  Denies any diarrhea.  No fever, chills, recent travel or sick contacts.  No pain with urination or blood in the urine.  Last menstrual cycle was at the beginning of July.  Did have some pink discharge several days ago but no bleeding.  States that there is a possibility she could be pregnant. Has had abdominal "cramping" but no abdominal pain.  The history is provided by the patient and a relative.    Past Medical History:  Diagnosis Date  . Medical history non-contributory     Patient Active Problem List   Diagnosis Date Noted  . Left ankle injury 02/17/2016  . Acute blood loss anemia 12/02/2013  . 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 BREAST SURGERY    . BUNIONECTOMY    . DILATION AND EVACUATION N/A 12/02/2013   Procedure: DILATATION AND EVACUATION;  Surgeon: Reva Bores, MD;  Location: [facility];  Service: Gynecology;  Laterality: N/A;  . WISDOM TOOTH EXTRACTION       OB History    Gravida  4   Para  1   Term  1   Preterm      AB  1   Living  1     SAB  0   TAB      Ectopic      Multiple      Live Births               Home Medications    Prior to Admission medications   Not on File    Family History No family history on file.  Social History Social History   Tobacco Use  . Smoking status: Never Smoker  . Smokeless tobacco: Never Used  Substance Use Topics  . Alcohol use: Yes    Comment: several times/year  . Drug use: Yes    Types: Marijuana    Comment:  rarely     Allergies   Patient has no known allergies.   Review of Systems Review of Systems  Constitutional: Negative for activity change, appetite change and fever.  HENT: Negative for congestion and rhinorrhea.   Respiratory: Negative for choking, chest tightness and shortness of breath.   Gastrointestinal: Positive for abdominal pain, nausea and vomiting. Negative for diarrhea.  Genitourinary: Negative for dysuria, vaginal bleeding and vaginal discharge.  Musculoskeletal: Negative for arthralgias and myalgias.  Skin: Negative for rash.  Neurological: Negative for dizziness, weakness and headaches.    all other systems are negative except as noted in the HPI and PMH.    Physical Exam Updated Vital Signs BP (!) 144/89 (BP Location: Left Arm)   Pulse 83   Temp 99.4 F (37.4 C) (Oral)   Resp 16   Ht 5\' 8"  (1.727 m)   Wt 77.1 kg (170 lb)   LMP 01/28/2018 (Exact Date)   SpO2 100%   BMI 25.85 kg/m   Physical Exam  Constitutional: She is oriented to person, place, and time. She  appears well-developed and well-nourished. No distress.  Laughing with family  HENT:  Head: Normocephalic and atraumatic.  Mouth/Throat: Oropharynx is clear and moist. No oropharyngeal exudate.  Eyes: Pupils are equal, round, and reactive to light. Conjunctivae and EOM are normal.  Neck: Normal range of motion. Neck supple.  No meningismus.  Cardiovascular: Normal rate, regular rhythm, normal heart sounds and intact distal pulses.  No murmur heard. Pulmonary/Chest: Effort normal and breath sounds normal. No respiratory distress.  Abdominal: Soft. There is no tenderness. There is no rebound and no guarding.  Musculoskeletal: Normal range of motion. She exhibits no edema or tenderness.  Neurological: She is alert and oriented to person, place, and time. No cranial nerve deficit. She exhibits normal muscle tone. Coordination normal.  No ataxia on finger to nose bilaterally. No pronator drift. 5/5  strength throughout. CN 2-12 intact.Equal grip strength. Sensation intact.   Skin: Skin is warm.  Psychiatric: She has a normal mood and affect. Her behavior is normal.  Nursing note and vitals reviewed.    ED Treatments / Results  Labs (all labs ordered are listed, but only abnormal results are displayed) Labs Reviewed  URINALYSIS, ROUTINE W REFLEX MICROSCOPIC - Abnormal; Notable for the following components:      Result Value   APPearance CLOUDY (*)    Leukocytes, UA SMALL (*)    All other components within normal limits  PREGNANCY, URINE - Abnormal; Notable for the following components:   Preg Test, Ur POSITIVE (*)    All other components within normal limits  URINALYSIS, MICROSCOPIC (REFLEX) - Abnormal; Notable for the following components:   Bacteria, UA MANY (*)    Trichomonas, UA PRESENT (*)    All other components within normal limits  URINE CULTURE    EKG None  Radiology No results found.  Procedures Procedures (including critical care time)  Medications Ordered in ED Medications  promethazine (PHENERGAN) tablet 25 mg (has no administration in time range)     Initial Impression / Assessment and Plan / ED Course  I have reviewed the triage vital signs and the nursing notes.  Pertinent labs & imaging results that were available during my care of the patient were reviewed by me and considered in my medical decision making (see chart for details).    Intermittent vomiting and abdominal cramping over the past several days.  Vitals stable.  Abdomen is soft and nontender.  Pregnancy test found to be positive.  Discussed with patient.  Denies any abdominal pain at this time.  Had some pink discharge several days ago but no bleeding.  Bacteriuria noted with squamous cells.  Culture sent.  Trichomonas noted in urine.  Patient left before receiving treatment for trichomonas.  She was called and informed of results and Flagyl was sent to her pharmacy and she  understands to pick that up.  She will also schedule an ultrasound to assess her pregnancy.  Low suspicion for ectopic pregnancy at this time as patient has no vaginal bleeding or abdominal pain.  Follow-up with OB.  Return precautions discussed.   Final Clinical Impressions(s) / ED Diagnoses   Final diagnoses:  Less than [redacted] weeks gestation of pregnancy  Nausea and vomiting in pregnancy  Trichomoniasis    ED Discharge Orders    None       Francena Zender, Jeannett SeniorStephen, MD 02/27/18 [phone]

## 2018-02-27 NOTE — Discharge Instructions (Addendum)
Your pregnancy test is positive.  Take the nausea medication as prescribed and follow-up with the obstetric doctor.  Return for ultrasound to assess this pregnancy.  You may schedule a time that is convenient for you.  Return to the ED with worsening pain, bleeding or any other concerns.

## 2018-02-27 NOTE — ED Triage Notes (Signed)
Pt c/o n/v x several days. Denies diarrhea

## 2018-02-28 LAB — URINE CULTURE: Culture: <10000 — AB

## 2018-03-29 ENCOUNTER — Other Ambulatory Visit: Payer: Self-pay

## 2018-03-29 ENCOUNTER — Emergency Department (HOSPITAL_BASED_OUTPATIENT_CLINIC_OR_DEPARTMENT_OTHER)
Admission: EM | Admit: 2018-03-29 | Discharge: 2018-03-29 | Disposition: A | Discharge status: Home / Self Care | Payer: Medicaid Other | Attending: Emergency Medicine | Admitting: Emergency Medicine

## 2018-03-29 ENCOUNTER — Encounter (HOSPITAL_BASED_OUTPATIENT_CLINIC_OR_DEPARTMENT_OTHER): Payer: Self-pay

## 2018-03-29 DIAGNOSIS — O210 Mild hyperemesis gravidarum: Secondary | ICD-10-CM | POA: Insufficient documentation

## 2018-03-29 DIAGNOSIS — O26891 Other specified pregnancy related conditions, first trimester: Secondary | ICD-10-CM | POA: Diagnosis not present

## 2018-03-29 DIAGNOSIS — F1210 Cannabis abuse, uncomplicated: Secondary | ICD-10-CM | POA: Diagnosis not present

## 2018-03-29 DIAGNOSIS — Z3A08 8 weeks gestation of pregnancy: Secondary | ICD-10-CM | POA: Diagnosis not present

## 2018-03-29 DIAGNOSIS — R1013 Epigastric pain: Secondary | ICD-10-CM | POA: Insufficient documentation

## 2018-03-29 LAB — URINALYSIS, ROUTINE W REFLEX MICROSCOPIC
BILIRUBIN URINE: NEGATIVE
GLUCOSE, UA: NEGATIVE mg/dL
HGB URINE DIPSTICK: NEGATIVE
Ketones, ur: 15 mg/dL — AB
Leukocytes, UA: NEGATIVE
Nitrite: NEGATIVE
PROTEIN: NEGATIVE mg/dL
Specific Gravity, Urine: <1.005 — ABNORMAL LOW (ref 1.005–1.030)
pH: 7 (ref 5.0–8.0)

## 2018-03-29 LAB — CBC WITH DIFFERENTIAL/PLATELET
Basophils Absolute: 0 10*3/uL (ref 0.0–0.1)
Basophils Relative: 0 %
EOS ABS: 0.1 10*3/uL (ref 0.0–0.7)
EOS PCT: 1 %
HCT: 39.3 % (ref 36.0–46.0)
Hemoglobin: 13.1 g/dL (ref 12.0–15.0)
LYMPHS ABS: 1.8 10*3/uL (ref 0.7–4.0)
LYMPHS PCT: 20 %
MCH: 30 pg (ref 26.0–34.0)
MCHC: 33.3 g/dL (ref 30.0–36.0)
MCV: 89.9 fL (ref 78.0–100.0)
Monocytes Absolute: 0.6 10*3/uL (ref 0.1–1.0)
Monocytes Relative: 6 %
Neutro Abs: 6.7 10*3/uL (ref 1.7–7.7)
Neutrophils Relative %: 73 %
PLATELETS: 270 10*3/uL (ref 150–400)
RBC: 4.37 MIL/uL (ref 3.87–5.11)
RDW: 13.6 % (ref 11.5–15.5)
WBC: 9.2 10*3/uL (ref 4.0–10.5)

## 2018-03-29 LAB — COMPREHENSIVE METABOLIC PANEL
ALT: 15 U/L (ref 0–44)
AST: 20 U/L (ref 15–41)
Albumin: 4.2 g/dL (ref 3.5–5.0)
Alkaline Phosphatase: 43 U/L (ref 38–126)
Anion gap: 10 (ref 5–15)
BUN: 9 mg/dL (ref 6–20)
CHLORIDE: 103 mmol/L (ref 98–111)
CO2: 24 mmol/L (ref 22–32)
CREATININE: 0.69 mg/dL (ref 0.44–1.00)
Calcium: 9.1 mg/dL (ref 8.9–10.3)
GFR calc Af Amer: >60 mL/min (ref >60)
Glucose, Bld: 92 mg/dL (ref 70–99)
POTASSIUM: 3.2 mmol/L — AB (ref 3.5–5.1)
Sodium: 137 mmol/L (ref 135–145)
Total Bilirubin: 0.6 mg/dL (ref 0.3–1.2)
Total Protein: 7.9 g/dL (ref 6.5–8.1)

## 2018-03-29 MED ORDER — LACTATED RINGERS IV BOLUS
1000.0000 mL | Freq: Once | INTRAVENOUS | Status: AC
  Administered 2018-03-29: 1000 mL via INTRAVENOUS

## 2018-03-29 MED ORDER — DOXYLAMINE-PYRIDOXINE 10-10 MG PO TBEC: 1.0000 | DELAYED_RELEASE_TABLET | Freq: Two times a day (BID) | ORAL | 0 refills | Status: AC | PRN

## 2018-03-29 MED ORDER — ONDANSETRON HCL 4 MG/2ML IJ SOLN
4.0000 mg | Freq: Once | INTRAMUSCULAR | Status: AC
  Administered 2018-03-29: 4 mg via INTRAVENOUS
  Filled 2018-03-29: qty 2

## 2018-03-29 MED ORDER — ONDANSETRON 4 MG PO TBDP: 4.0000 mg | ORAL_TABLET | Freq: Three times a day (TID) | ORAL | 0 refills | Status: AC | PRN

## 2018-03-29 MED ORDER — DOXYLAMINE-PYRIDOXINE 10-10 MG PO TBEC: 1.0000 | DELAYED_RELEASE_TABLET | Freq: Two times a day (BID) | ORAL | 0 refills | Status: DC | PRN

## 2018-03-29 MED ORDER — ONDANSETRON 4 MG PO TBDP: 4.0000 mg | ORAL_TABLET | Freq: Three times a day (TID) | ORAL | 0 refills | Status: DC | PRN

## 2018-03-29 NOTE — ED Triage Notes (Signed)
Pt states [redacted]wks pregnant, can't keep anything down

## 2018-03-29 NOTE — ED Notes (Signed)
ED Provider at bedside. 

## 2018-03-29 NOTE — ED Provider Notes (Signed)
[facility] EMERGENCY DEPARTMENT Provider Note   CSN: [id number] Arrival date & time: 03/29/18  0831     History   Chief Complaint Chief Complaint  Patient presents with  . Morning Sickness    HPI Amanda Rubio is a 27 y.o. female.  The history is provided by the patient. No language interpreter was used.   Amanda Rubio is a G34P1 27 y.o. female who presents to the Emergency Department complaining of morning sickness. She presents the emergency department complaining of nausea and vomiting for the last two weeks. Initially she was having emesis only in the morning and for the last week she has had vomiting throughout the entire day. She describes some epigastric burning associated with the vomiting. She states she cannot keep down any liquids or food. She is currently eight weeks pregnant, LMP on July 7. She is scheduled to follow-up with [facility] on the 19th. Past Medical History:  Diagnosis Date  . Medical history non-contributory     Patient Active Problem List   Diagnosis Date Noted  . Left ankle injury 02/17/2016  . Acute blood loss anemia 12/02/2013  . 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 BREAST SURGERY    . BUNIONECTOMY    . DILATION AND EVACUATION N/A 12/02/2013   Procedure: DILATATION AND EVACUATION;  Surgeon: Reva Bores, MD;  Location: [facility];  Service: Gynecology;  Laterality: N/A;  . WISDOM TOOTH EXTRACTION       OB History    Gravida  5   Para  1   Term  1   Preterm      AB  1   Living  1     SAB  0   TAB      Ectopic      Multiple      Live Births               Home Medications    Prior to Admission medications   Medication Sig Start Date End Date Taking? Authorizing Provider  Doxylamine-Pyridoxine 10-10 MG TBEC Take 1 tablet by mouth 2 (two) times daily as needed. 03/29/18   Tilden Fossa, MD  ondansetron (ZOFRAN ODT) 4 MG  disintegrating tablet Take 1 tablet (4 mg total) by mouth every 8 (eight) hours as needed for nausea or vomiting. 03/29/18   Tilden Fossa, MD    Family History No family history on file.  Social History Social History   Tobacco Use  . Smoking status: Never Smoker  . Smokeless tobacco: Never Used  Substance Use Topics  . Alcohol use: Yes    Comment: several times/year  . Drug use: Yes    Types: Marijuana    Comment: rarely     Allergies   Patient has no known allergies.   Review of Systems Review of Systems  All other systems reviewed and are negative.    Physical Exam Updated Vital Signs BP 105/75 (BP Location: Right Arm)   Pulse 71   Temp 98.4 F (36.9 C) (Oral)   Resp 16   Ht 5\' 8"  (1.727 m)   Wt 76.2 kg   LMP 01/28/2018   SpO2 100%   BMI 25.54 kg/m   Physical Exam  Constitutional: She is oriented to person, place, and time. She appears well-developed and well-nourished.  HENT:  Head: Normocephalic and atraumatic.  Cardiovascular: Normal rate and regular rhythm.  No murmur  heard. Pulmonary/Chest: Effort normal and breath sounds normal. No respiratory distress.  Abdominal: Soft. There is no tenderness. There is no rebound and no guarding.  Musculoskeletal: She exhibits no edema or tenderness.  Neurological: She is alert and oriented to person, place, and time.  Skin: Skin is warm and dry.  Psychiatric: She has a normal mood and affect. Her behavior is normal.  Nursing note and vitals reviewed.    ED Treatments / Results  Labs (all labs ordered are listed, but only abnormal results are displayed) Labs Reviewed  COMPREHENSIVE METABOLIC PANEL - Abnormal; Notable for the following components:      Result Value   Potassium 3.2 (*)    All other components within normal limits  URINALYSIS, ROUTINE W REFLEX MICROSCOPIC - Abnormal; Notable for the following components:   APPearance CLOUDY (*)    Specific Gravity, Urine <1.005 (*)    Ketones, ur 15 (*)     All other components within normal limits  CBC WITH DIFFERENTIAL/PLATELET    EKG None  Radiology No results found.  Procedures Procedures (including critical care time) EMERGENCY DEPARTMENT [place] PREGNANCY "Study: Limited Ultrasound of the Pelvis for Pregnancy"  INDICATIONS:Pregnancy(required) and Abdominal or pelvic pain Multiple views of the uterus and pelvic cavity were obtained in real-time with a multi-frequency probe.  APPROACH:Transabdominal  PERFORMED BY: Myself IMAGES ARCHIVED?: Yes LIMITATIONS: Decompressed bladder PREGNANCY FREE FLUID: None ADNEXAL FINDINGS: none GESTATIONAL AGE, ESTIMATE: not performed FETAL HEART RATE: 158 INTERPRETATION: Fetal heart activity seen    Medications Ordered in ED Medications  lactated ringers bolus 1,000 mL (0 mLs Intravenous Stopped 03/29/18 1052)  ondansetron (ZOFRAN) injection 4 mg (4 mg Intravenous Given 03/29/18 0935)  lactated ringers bolus 1,000 mL (0 mLs Intravenous Stopped 03/29/18 1156)     Initial Impression / Assessment and Plan / ED Course  I have reviewed the triage vital signs and the nursing notes.  Pertinent labs & imaging results that were available during my care of the patient were reviewed by me and considered in my medical decision making (see chart for details).     Patient here for nausea and vomiting, recently told she was pregnant weeks ago. She is non-toxic appearing on examination with no significant abdominal tenderness. She does have mild dehydration was treated with IV fluids and antiemetics. Following treatment in the department she was able to tolerate ginger ale without difficulty. Discussed with patient home care for morning sickness. Discussed OB/GYN follow-up as well as return precautions. Presentation is not consistent with urinary tract infection, bowel obstruction, ectopic pregnancy.  Final Clinical Impressions(s) / ED Diagnoses   Final diagnoses:  Morning sickness    ED Discharge Orders          Ordered    ondansetron (ZOFRAN ODT) 4 MG disintegrating tablet  Every 8 hours PRN,   Status:  Discontinued     03/29/18 1143    Doxylamine-Pyridoxine 10-10 MG TBEC  2 times daily PRN,   Status:  Discontinued     03/29/18 1143    Doxylamine-Pyridoxine 10-10 MG TBEC  2 times daily PRN     03/29/18 1204    ondansetron (ZOFRAN ODT) 4 MG disintegrating tablet  Every 8 hours PRN     03/29/18 1204           Tilden Fossa, MD 03/29/18 1513

## 2018-04-16 ENCOUNTER — Other Ambulatory Visit: Payer: Self-pay

## 2018-04-16 ENCOUNTER — Inpatient Hospital Stay (HOSPITAL_COMMUNITY)
Admission: AD | Admit: 2018-04-16 | Discharge: 2018-04-16 | Disposition: A | Discharge status: Home / Self Care | Payer: Medicaid Other | Source: Ambulatory Visit | Attending: Obstetrics and Gynecology | Admitting: Obstetrics and Gynecology

## 2018-04-16 ENCOUNTER — Inpatient Hospital Stay (HOSPITAL_COMMUNITY): Payer: Medicaid Other

## 2018-04-16 ENCOUNTER — Encounter (HOSPITAL_COMMUNITY): Payer: Self-pay | Admitting: *Deleted

## 2018-04-16 DIAGNOSIS — R109 Unspecified abdominal pain: Secondary | ICD-10-CM | POA: Diagnosis present

## 2018-04-16 DIAGNOSIS — O209 Hemorrhage in early pregnancy, unspecified: Secondary | ICD-10-CM

## 2018-04-16 DIAGNOSIS — O208 Other hemorrhage in early pregnancy: Secondary | ICD-10-CM | POA: Insufficient documentation

## 2018-04-16 DIAGNOSIS — Z3A10 10 weeks gestation of pregnancy: Secondary | ICD-10-CM | POA: Insufficient documentation

## 2018-04-16 DIAGNOSIS — Z3481 Encounter for supervision of other normal pregnancy, first trimester: Secondary | ICD-10-CM

## 2018-04-16 LAB — WET PREP, GENITAL
Clue Cells Wet Prep HPF POC: NONE SEEN
SPERM: NONE SEEN
TRICH WET PREP: NONE SEEN
Yeast Wet Prep HPF POC: NONE SEEN

## 2018-04-16 LAB — URINALYSIS, ROUTINE W REFLEX MICROSCOPIC
Bilirubin Urine: NEGATIVE
Glucose, UA: 150 mg/dL — AB
Hgb urine dipstick: NEGATIVE
Ketones, ur: 5 mg/dL — AB
LEUKOCYTES UA: NEGATIVE
NITRITE: NEGATIVE
Protein, ur: NEGATIVE mg/dL
SPECIFIC GRAVITY, URINE: 1.025 (ref 1.005–1.030)
pH: 6 (ref 5.0–8.0)

## 2018-04-16 LAB — CBC
HEMATOCRIT: 33 % — AB (ref 36.0–46.0)
HEMOGLOBIN: 11.4 g/dL — AB (ref 12.0–15.0)
MCH: 30.9 pg (ref 26.0–34.0)
MCHC: 34.5 g/dL (ref 30.0–36.0)
MCV: 89.4 fL (ref 78.0–100.0)
Platelets: 254 10*3/uL (ref 150–400)
RBC: 3.69 MIL/uL — ABNORMAL LOW (ref 3.87–5.11)
RDW: 13.5 % (ref 11.5–15.5)
WBC: 8.5 10*3/uL (ref 4.0–10.5)

## 2018-04-16 LAB — HCG, QUANTITATIVE, PREGNANCY: hCG, Beta Chain, Quant, S: 65597 m[IU]/mL — ABNORMAL HIGH (ref <5)

## 2018-04-16 NOTE — MAU Note (Signed)
Kind of started yesterday, waited too long to eat.  Have dizzy/ weak spells.  Noted streaks of blood the last couple day.  Minor cramps, pulling sensation.  Today noted blood again, cramps were a little more severe.

## 2018-04-16 NOTE — Discharge Instructions (Signed)

## 2018-04-16 NOTE — MAU Provider Note (Signed)
History     CSN: [id number]  Arrival date and time: 04/16/18 1324   First Provider Initiated Contact with Patient 04/16/18 1440      Chief Complaint  Patient presents with  . Vaginal Bleeding  . Abdominal Pain  . Dizziness   HPI Amanda Rubio is a 27 y.o. G5P1011 at 1862w1d who presents to MAU with multiple complaints:  Abdominal cramping This is a new problem, onset within the past three days. Patient reports "cramping" across mid abdomen, 2-3/10, does not radiate, no aggravating or alleviating factors. 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  Vaginal spotting This is a new problem, onset within the past three days in conjunction with abdominal cramping. Patient endorses intermittent dark brown spotting. Denies heavy vaginal bleeding, abnormal vaginal discharge, intercourse.  [facility]. States she had IUP confirmed via ultrasound last week.  OB History    Gravida  5   Para  1   Term  1   Preterm      AB  1   Living  1     SAB  0   TAB      Ectopic      Multiple      Live Births              Past Medical History:  Diagnosis Date  . Medical history non-contributory     Past Surgical History:  Procedure Laterality Date  . BREAST SURGERY    . BUNIONECTOMY    . DILATION AND EVACUATION N/A 12/02/2013   Procedure: DILATATION AND EVACUATION;  Surgeon: Reva Boresanya S Pratt, MD;  Location: [facility];  Service: Gynecology;  Laterality: N/A;  . WISDOM TOOTH EXTRACTION      History reviewed. No pertinent family history.  Social History   Tobacco Use  . Smoking status: Never Smoker  . Smokeless tobacco: Never Used  Substance Use Topics  . Alcohol use: Yes    Comment: several times/year  . Drug use: Yes    Types: Marijuana    Comment: rarely    Allergies: No Known Allergies  Medications Prior to  Admission  Medication Sig Dispense Refill Last Dose  . Doxylamine-Pyridoxine 10-10 MG TBEC Take 1 tablet by mouth 2 (two) times daily as needed. 14 tablet 0   . ondansetron (ZOFRAN ODT) 4 MG disintegrating tablet Take 1 tablet (4 mg total) by mouth every 8 (eight) hours as needed for nausea or vomiting. 10 tablet 0     Review of Systems  Constitutional: Negative for chills and fever.  Respiratory: Negative for shortness of breath.   Gastrointestinal: Positive for abdominal pain. Negative for nausea and vomiting.  Genitourinary: Positive for vaginal bleeding. Negative for difficulty urinating, vaginal discharge and vaginal pain.  Musculoskeletal: Negative for back pain.  All other systems reviewed and are negative.  Physical Exam   Blood pressure 126/65, pulse 84, temperature 98.4 F (36.9 C), temperature source Oral, resp. rate 16, weight 80.2 kg, last menstrual period 01/28/2018, SpO2 100 %, unknown if currently breastfeeding.  Physical Exam  Nursing note and vitals reviewed. Constitutional: She is oriented to person, place, and time. She appears well-developed and well-nourished.  Respiratory: Effort normal and breath sounds normal.  GI: Soft. Bowel sounds are normal. She exhibits no distension. There is no tenderness. There is no rebound, no guarding and no CVA  tenderness.  Genitourinary: Vagina normal and uterus normal. Cervix exhibits no motion tenderness, no discharge and no friability. No vaginal discharge found.  Genitourinary Comments: No abnormal discharge or bleeding observed on swab collection  Neurological: She is alert and oriented to person, place, and time. She has normal reflexes.  Skin: Skin is dry.  Psychiatric: She has a normal mood and affect. Her behavior is normal. Judgment and thought content normal.    MAU Course  Procedures  MDM  Patient Vitals for the past 24 hrs:  BP Temp Temp src Pulse Resp SpO2 Weight  04/16/18 1618 127/84 - - 80 - - -  04/16/18  1341 126/65 98.4 F (36.9 C) Oral 84 16 100 % 80.2 kg    Orders Placed This Encounter  Procedures  . Wet prep, genital  . US PELVIS (TRANSABDOMINAL ONLY)  . Urinalysis, Routine w reflex microscopic  . CBC  . hCG, quantitative, pregnancy  . Discharge patient   Results for orders placed or performed during the hospital encounter of 04/16/18 (from the past 24 hour(s))  Urinalysis, Routine w reflex microscopic     Status: Abnormal   Collection Time: 04/16/18  2:21 PM  Result Value Ref Range   Color, Urine YELLOW YELLOW   APPearance CLEAR CLEAR   Specific Gravity, Urine 1.025 1.005 - 1.030   pH 6.0 5.0 - 8.0   Glucose, UA 150 (A) NEGATIVE mg/dL   Hgb urine dipstick NEGATIVE NEGATIVE   Bilirubin Urine NEGATIVE NEGATIVE   Ketones, ur 5 (A) NEGATIVE mg/dL   Protein, ur NEGATIVE NEGATIVE mg/dL   Nitrite NEGATIVE NEGATIVE   Leukocytes, UA NEGATIVE NEGATIVE  CBC     Status: Abnormal   Collection Time: 04/16/18  2:40 PM  Result Value Ref Range   WBC 8.5 4.0 - 10.5 K/uL   RBC 3.69 (L) 3.87 - 5.11 MIL/uL   Hemoglobin 11.4 (L) 12.0 - 15.0 g/dL   HCT 16.1 (L) 09.6 - 04.5 %   MCV 89.4 78.0 - 100.0 fL   MCH 30.9 26.0 - 34.0 pg   MCHC 34.5 30.0 - 36.0 g/dL   RDW 40.9 81.1 - 91.4 %   Platelets 254 150 - 400 K/uL  hCG, quantitative, pregnancy     Status: Abnormal   Collection Time: 04/16/18  2:40 PM  Result Value Ref Range   hCG, Beta Chain, Quant, S 65,597 (H) <5 mIU/mL  Wet prep, genital     Status: Abnormal   Collection Time: 04/16/18  3:15 PM  Result Value Ref Range   Yeast Wet Prep HPF POC NONE SEEN NONE SEEN   Trich, Wet Prep NONE SEEN NONE SEEN   Clue Cells Wet Prep HPF POC NONE SEEN NONE SEEN   WBC, Wet Prep HPF POC FEW (A) NONE SEEN   Sperm NONE SEEN    US Pelvis (transabdominal Only)  Result Date: 04/16/2018 CLINICAL DATA:  Vaginal bleeding in first trimester pregnancy. EXAM: OBSTETRIC <14 WK ULTRASOUND TECHNIQUE: Transabdominal ultrasound was performed for evaluation of  the gestation as well as the maternal uterus and adnexal regions. COMPARISON:  02/27/2018 FINDINGS: Intrauterine gestational sac: Single Yolk sac:  No Embryo:  Yes Cardiac Activity: Visualized. Heart Rate: 165 bpm CRL:   39.4 mm   10 w 6 d                  [place] EDC: 11/06/2018 Maternal uterus/adnexae: Subchorionic hemorrhage: Small measuring 8 mm. Crosses the os along the anterior placenta. Right ovary: Normal Left  ovary: Normal Other :None Free fluid:  None IMPRESSION: 1. Single living intrauterine gestation with an estimated gestational age of [redacted] weeks and 6 days. 2. Small subchorionic hemorrhage. Electronically Signed   By: Signa Kell M.D.   On: 04/16/2018 16:04     Assessment and Plan  --27 y.o. G5P1011 with IUP at 10w 6d --Subchorionic hemorrhage. Discussed pelvic rest and possibility of future bleeding episodes. --Blood type A POS: Rhogam not indicated --Discharge home in stable condition  F/U: Pt has OB appt 04/24/18 in [facility]  [place], [place] 04/16/2018, 4:41 PM

## 2018-04-17 LAB — GC/CHLAMYDIA PROBE AMP (~~LOC~~) NOT AT ARMC
Chlamydia: NEGATIVE
NEISSERIA GONORRHEA: NEGATIVE

## 2019-01-29 ENCOUNTER — Encounter (HOSPITAL_COMMUNITY): Payer: Self-pay

## 2019-02-05 IMAGING — US US OB TRANSVAGINAL
1 series · 14 of 28 positions shown · non-contrast
Comparison: None.

CLINICAL DATA: Vaginal spotting in first trimester of pregnancy.

EXAM:
OBSTETRIC <14 WK US AND TRANSVAGINAL OB US
TECHNIQUE: Both transabdominal and transvaginal ultrasound examinations were
performed for complete evaluation of the gestation as well as the
maternal uterus, adnexal regions, and pelvic cul-de-sac.
Transvaginal technique was performed to assess early pregnancy.

[Series 1: us ob transvaginal · 0.21mm/px · 14 of 43 slices shown]
[im 2/43]
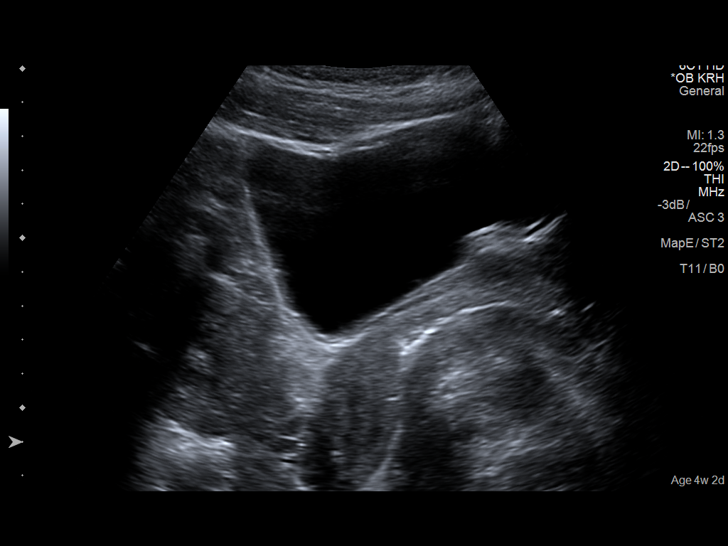
[im 5/43]
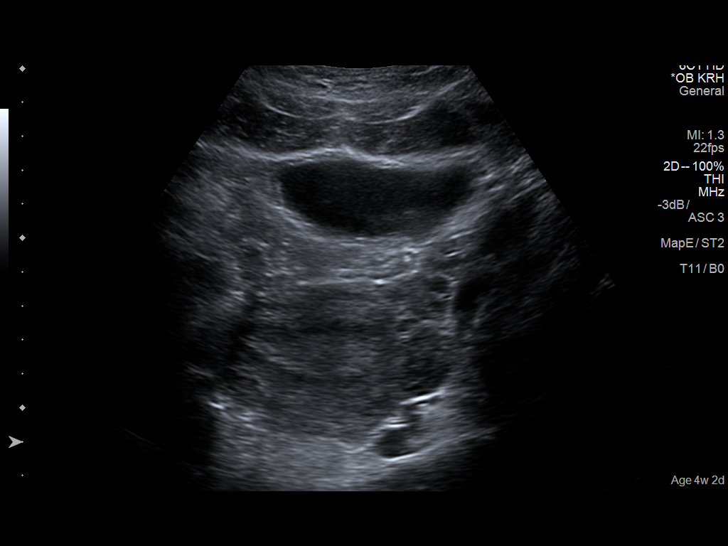
[im 8/43]
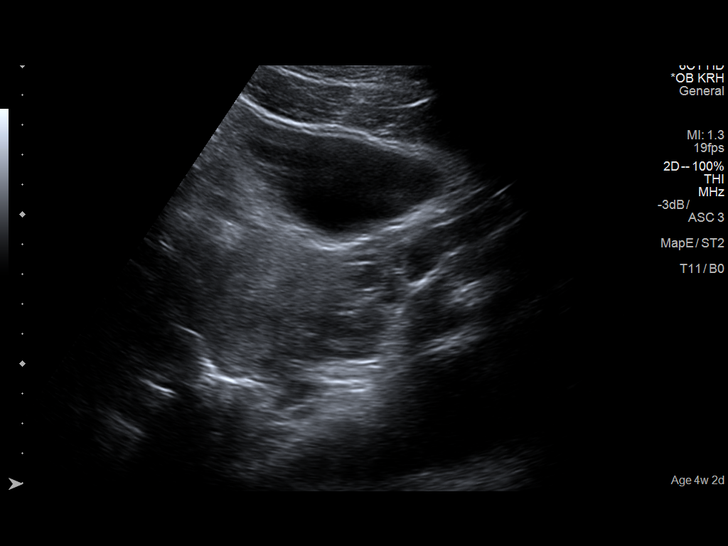
[im 11/43]
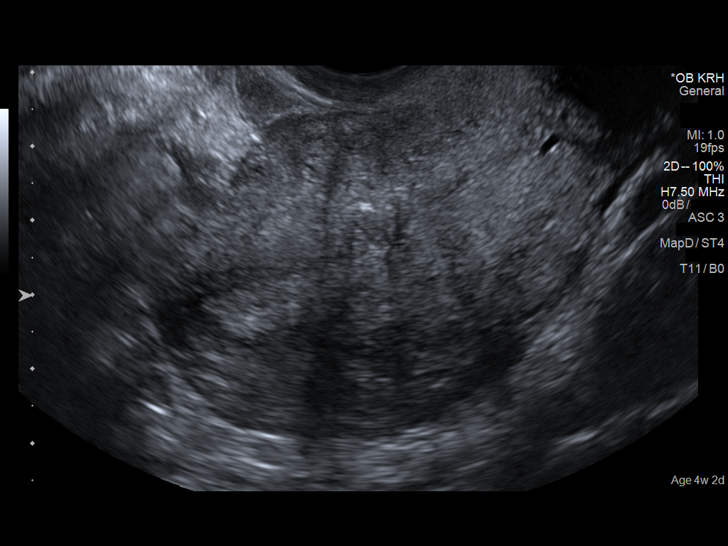
[im 15/43]
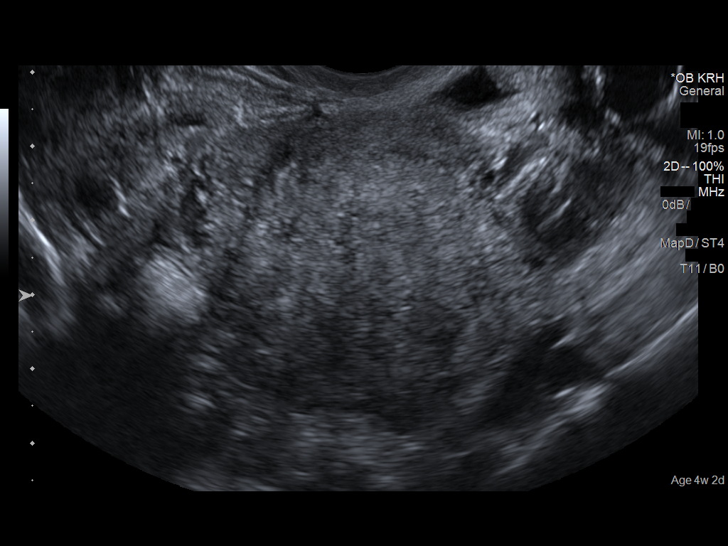
[im 18/43]
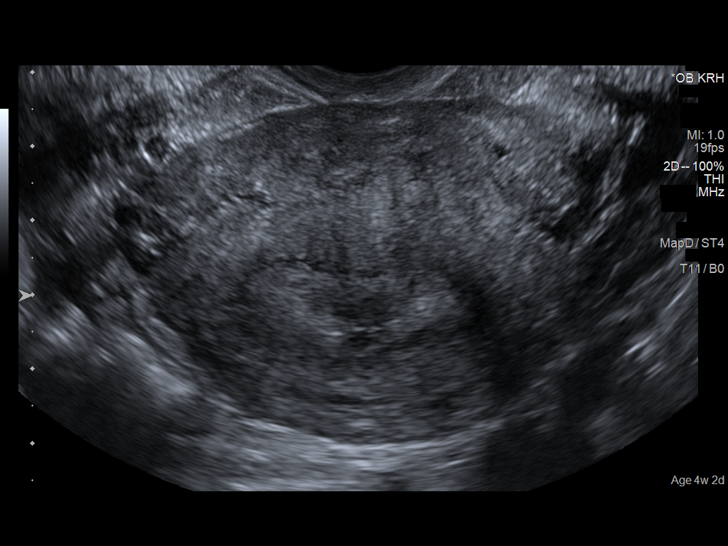
[im 21/43]
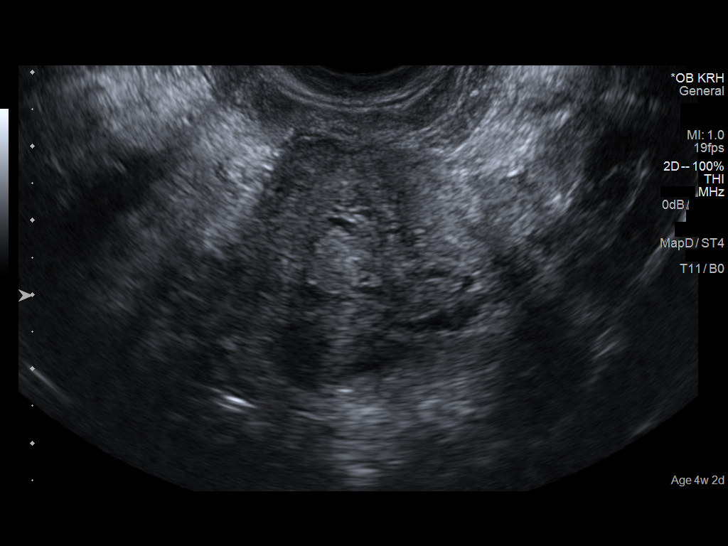
[im 24/43]
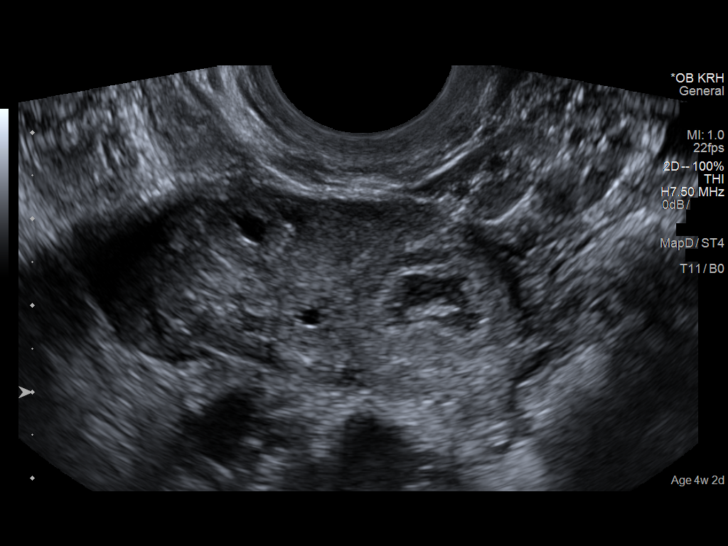
[im 27/43]
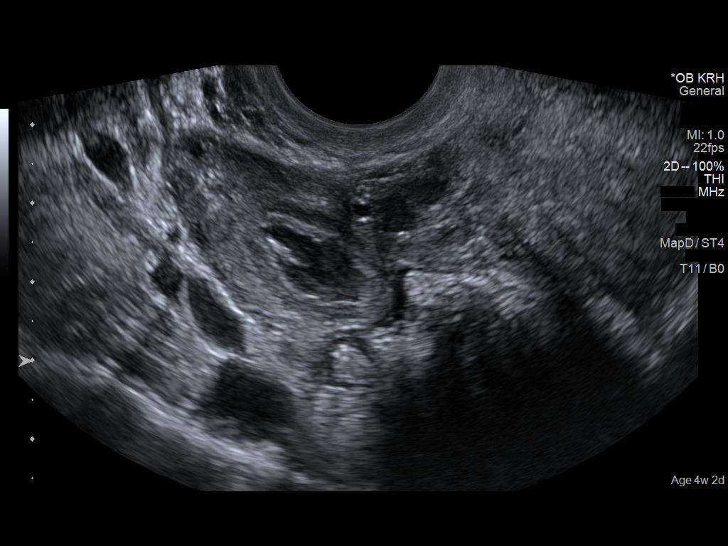
[im 30/43]
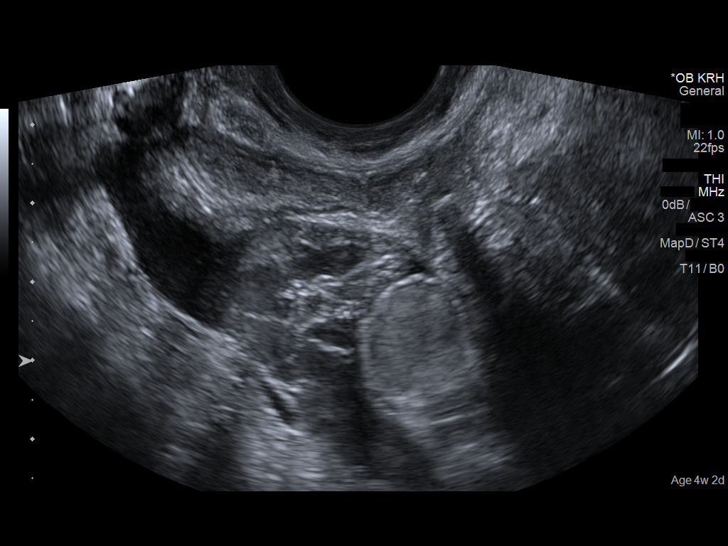
[im 33/43]
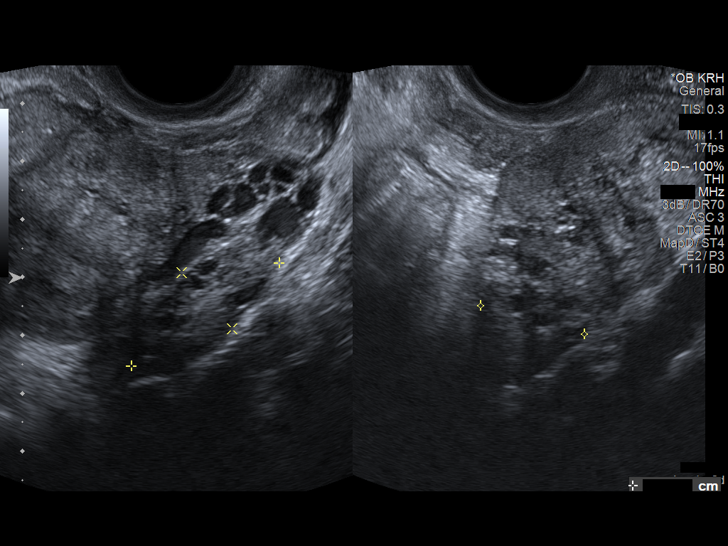
[im 36/43]
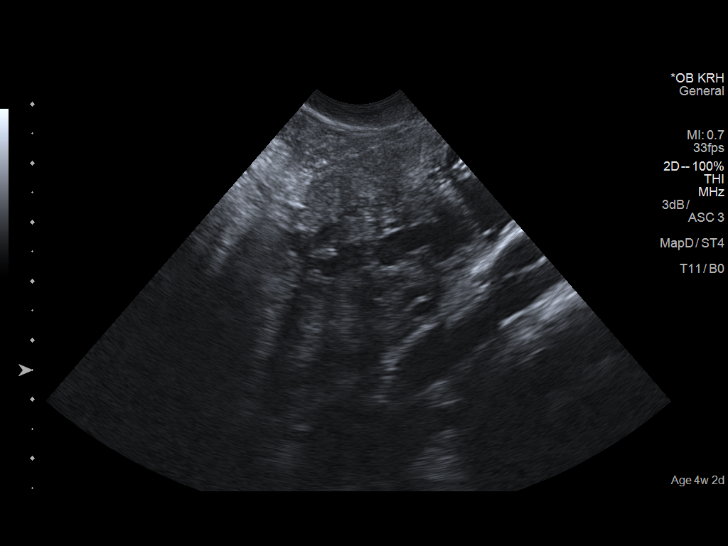
[im 39/43]
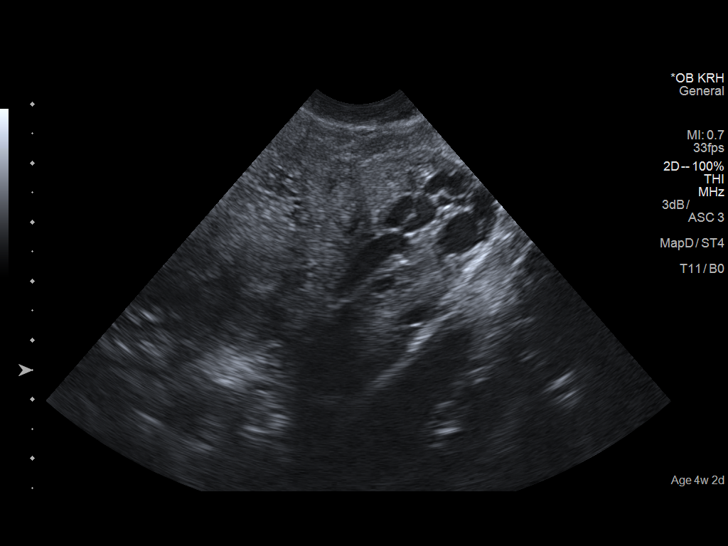
[im 43/43]
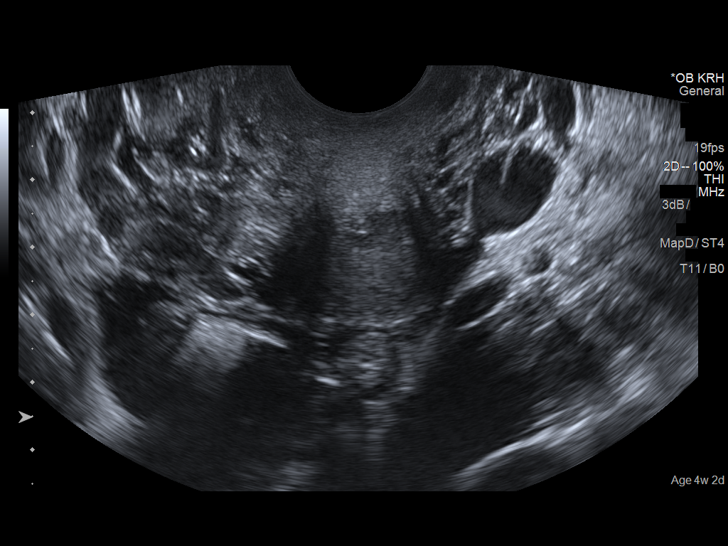

[14 of 28 positions shown; findings below may reference images not displayed]

FINDINGS: Intrauterine gestational sac: Not visualized.

Yolk sac:  Not visualized.

Embryo:  Not visualized.

Cardiac Activity: Not visualized.

Maternal uterus/adnexae: Probable corpus luteum cyst seen in right
ovary. Left ovary appears normal. Mild amount of free fluid is noted
which may be physiologic.
IMPRESSION: No intrauterine gestational sac, yolk sac, fetal pole, or cardiac
activity visualized. Differential considerations include
intrauterine gestation too early to be sonographically visualized,
spontaneous abortion, or ectopic pregnancy. Consider follow-up
ultrasound in 14 days and serial quantitative beta HCG follow-up.

## 2019-12-22 ENCOUNTER — Other Ambulatory Visit: Payer: Self-pay

## 2019-12-22 ENCOUNTER — Emergency Department (HOSPITAL_BASED_OUTPATIENT_CLINIC_OR_DEPARTMENT_OTHER)
Admission: EM | Admit: 2019-12-22 | Discharge: 2019-12-23 | Disposition: A | Discharge status: Home / Self Care | Payer: Medicaid Other | Attending: Emergency Medicine | Admitting: Emergency Medicine

## 2019-12-22 ENCOUNTER — Encounter (HOSPITAL_BASED_OUTPATIENT_CLINIC_OR_DEPARTMENT_OTHER): Payer: Self-pay | Admitting: *Deleted

## 2019-12-22 ENCOUNTER — Emergency Department (HOSPITAL_BASED_OUTPATIENT_CLINIC_OR_DEPARTMENT_OTHER): Payer: Medicaid Other

## 2019-12-22 DIAGNOSIS — T1490XA Injury, unspecified, initial encounter: Secondary | ICD-10-CM

## 2019-12-22 DIAGNOSIS — S93601A Unspecified sprain of right foot, initial encounter: Secondary | ICD-10-CM

## 2019-12-22 DIAGNOSIS — Y999 Unspecified external cause status: Secondary | ICD-10-CM | POA: Insufficient documentation

## 2019-12-22 DIAGNOSIS — Y92838 Other recreation area as the place of occurrence of the external cause: Secondary | ICD-10-CM | POA: Insufficient documentation

## 2019-12-22 DIAGNOSIS — Y9344 Activity, trampolining: Secondary | ICD-10-CM | POA: Diagnosis not present

## 2019-12-22 DIAGNOSIS — S99921A Unspecified injury of right foot, initial encounter: Secondary | ICD-10-CM | POA: Diagnosis present

## 2019-12-22 DIAGNOSIS — X58XXXA Exposure to other specified factors, initial encounter: Secondary | ICD-10-CM | POA: Insufficient documentation

## 2019-12-22 MED ORDER — IBUPROFEN 400 MG PO TABS
600.0000 mg | ORAL_TABLET | Freq: Once | ORAL | Status: AC
  Administered 2019-12-22: 600 mg via ORAL
  Filled 2019-12-22: qty 1

## 2019-12-22 NOTE — ED Provider Notes (Signed)
[facility] EMERGENCY DEPARTMENT Provider Note   CSN: [id number] Arrival date & time: 12/22/19  2249     History Chief Complaint  Patient presents with  . Foot Pain    Amanda Rubio is a 29 y.o. female.  HPI     This a 29 year old female who presents with right foot pain.  Patient reports that she was at the trampoline park chasing her 44-year-old when she missed stepped hurting her right foot.  She has been ambulatory.  Rates pain 8 out of 10.  Is mostly over the top of the foot.  She is not taking anything for pain.  Denies numbness or tingling.  Denies other injury.  Past Medical History:  Diagnosis Date  . Medical history non-contributory     Patient Active Problem List   Diagnosis Date Noted  . Left ankle injury 02/17/2016  . Acute blood loss anemia 12/02/2013  . 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 BREAST SURGERY    . BUNIONECTOMY    . DILATION AND EVACUATION N/A 12/02/2013   Procedure: DILATATION AND EVACUATION;  Surgeon: Reva Bores, MD;  Location: [facility];  Service: Gynecology;  Laterality: N/A;  . WISDOM TOOTH EXTRACTION       OB History    Gravida  5   Para  1   Term  1   Preterm      AB  1   Living  1     SAB  0   TAB      Ectopic      Multiple      Live Births              No family history on file.  Social History   Tobacco Use  . Smoking status: Never Smoker  . Smokeless tobacco: Never Used  Substance Use Topics  . Alcohol use: Yes    Comment: several times/year  . Drug use: Yes    Types: Marijuana    Comment: rarely    Home Medications Prior to Admission medications   Medication Sig Start Date End Date Taking? Authorizing Provider  Doxylamine-Pyridoxine 10-10 MG TBEC Take 1 tablet by mouth 2 (two) times daily as needed. 03/29/18   Tilden Fossa, MD  ibuprofen (ADVIL) 600 MG tablet Take 1 tablet (600 mg total) by mouth every  6 (six) hours as needed. 12/23/19   Joban Colledge, Mayer Masker, MD  ondansetron (ZOFRAN ODT) 4 MG disintegrating tablet Take 1 tablet (4 mg total) by mouth every 8 (eight) hours as needed for nausea or vomiting. 03/29/18   Tilden Fossa, MD    Allergies    Patient has no known allergies.  Review of Systems   Review of Systems  Musculoskeletal:       Right foot pain  Neurological: Negative for weakness and numbness.  All other systems reviewed and are negative.   Physical Exam Updated Vital Signs BP (!) 144/105 (BP Location: Right Arm)   Pulse 80   Temp 98.3 F (36.8 C) (Oral)   Resp 18   Ht 1.727 m (5\' 8" )   Wt 77.1 kg   SpO2 100%   Breastfeeding Yes   BMI 25.85 kg/m   Physical Exam Vitals and nursing note reviewed.  Constitutional:      Appearance: She is well-developed. She is not ill-appearing.  HENT:     Head: Normocephalic and atraumatic.     Nose:  Nose normal.     Mouth/Throat:     Mouth: Mucous membranes are moist.  Eyes:     Pupils: Pupils are equal, round, and reactive to light.  Cardiovascular:     Rate and Rhythm: Normal rate and regular rhythm.  Pulmonary:     Effort: Pulmonary effort is normal. No respiratory distress.  Musculoskeletal:     Cervical back: Neck supple.     Comments: Focused examination of the right foot and ankle with tenderness to palpation over the dorsum of the foot just distal to the ankle with slight swelling noted, 2+ DP pulse, normal range of motion of the ankle, no medial or lateral malleoli or tenderness, neurovascular intact distally  Skin:    General: Skin is warm and dry.  Neurological:     Mental Status: She is alert and oriented to person, place, and time.  Psychiatric:        Mood and Affect: Mood normal.     ED Results / Procedures / Treatments   Labs (all labs ordered are listed, but only abnormal results are displayed) Labs Reviewed - No data to display  EKG None  Radiology DG Foot Complete Right  Result Date:  12/22/2019 CLINICAL DATA:  Right foot pain, onset tonight while jumping at trampoline park. Pain around the fourth-fifth metatarsal. EXAM: RIGHT FOOT COMPLETE - 3+ VIEW COMPARISON:  None. FINDINGS: Postsurgical change of the first metatarsal post bunionectomy. Mild hallux valgus. No evidence of acute fracture. No dislocation. Mild generalized soft tissue edema over the dorsum of the foot. IMPRESSION: Soft tissue edema without acute fracture. Post bunionectomy and mild hallux valgus. Electronically Signed   By: Keith Rake M.D.   On: 12/22/2019 23:44    Procedures Procedures (including critical care time)  Medications Ordered in ED Medications  ibuprofen (ADVIL) tablet 600 mg (600 mg Oral Given 12/22/19 2319)    ED Course  I have reviewed the triage vital signs and the nursing notes.  Pertinent labs & imaging results that were available during my care of the patient were reviewed by me and considered in my medical decision making (see chart for details).    MDM Rules/Calculators/A&P                       Patient presents with right foot injury.  She is overall nontoxic and vital signs are reassuring.  She is neurovascular intact.  She has some swelling over the dorsum of the foot with tenderness to palpation.  No midfoot tenderness to suggest Lisfranc injury.  X-rays obtained show no evidence of foot fracture.  Suspect sprain given mechanism.  Recommend ice and anti-inflammatories.  Patient is breast-feeding and was advised ibuprofen was safe during breast-feeding.  Recommend well fitting shoes.  Sports medicine follow-up provided.  After history, exam, and medical workup I feel the patient has been appropriately medically screened and is safe for discharge home. Pertinent diagnoses were discussed with the patient. Patient was given return precautions.   Final Clinical Impression(s) / ED Diagnoses Final diagnoses:  Injury  Sprain of right foot, initial encounter    Rx / DC  Orders ED Discharge Orders         Ordered    ibuprofen (ADVIL) 600 MG tablet  Every 6 hours PRN     12/23/19 0009           Merryl Hacker, MD 12/23/19 0011

## 2019-12-22 NOTE — ED Triage Notes (Signed)
Right foot pain that started tonight while jumping at the trampoline park.

## 2019-12-23 MED ORDER — IBUPROFEN 600 MG PO TABS: 600.0000 mg | ORAL_TABLET | Freq: Four times a day (QID) | ORAL | 0 refills | Status: AC | PRN

## 2019-12-23 NOTE — Discharge Instructions (Signed)
You were seen today for right foot pain.  Your x-rays are negative for fracture.  Apply ice and wear good fitting shoes.  Take ibuprofen as needed for pain.  Follow-up with sports medicine if not improving.

## 2019-12-23 NOTE — ED Notes (Signed)
In to d/c patient and she request an ace wrap. Able to bear weight, but painful.Dr. Wilkie Aye aware. Ace wrap placed by Amy, EMT
